# Patient Record
Sex: Male | Born: 1965 | Race: White | Hispanic: No | Marital: Single | Smoking: Never smoker
Health system: Southern US, Community
[De-identification: ages and names within clinical notes are randomized; demographics above are authoritative.]

## PROBLEM LIST (undated history)

## (undated) DIAGNOSIS — I471 Supraventricular tachycardia, unspecified: Secondary | ICD-10-CM

## (undated) DIAGNOSIS — G4730 Sleep apnea, unspecified: Secondary | ICD-10-CM

## (undated) DIAGNOSIS — G4733 Obstructive sleep apnea (adult) (pediatric): Secondary | ICD-10-CM

## (undated) DIAGNOSIS — K219 Gastro-esophageal reflux disease without esophagitis: Secondary | ICD-10-CM

## (undated) DIAGNOSIS — Z85528 Personal history of other malignant neoplasm of kidney: Secondary | ICD-10-CM

## (undated) DIAGNOSIS — R002 Palpitations: Secondary | ICD-10-CM

## (undated) DIAGNOSIS — E291 Testicular hypofunction: Secondary | ICD-10-CM

## (undated) HISTORY — DX: Obstructive sleep apnea (adult) (pediatric): G47.33

## (undated) HISTORY — DX: Morbid (severe) obesity due to excess calories: E66.01

## (undated) HISTORY — DX: Palpitations: R00.2

## (undated) HISTORY — PX: WRIST SURGERY: SHX841

## (undated) HISTORY — DX: Gastro-esophageal reflux disease without esophagitis: K21.9

## (undated) HISTORY — DX: Personal history of other malignant neoplasm of kidney: Z85.528

## (undated) HISTORY — DX: Testicular hypofunction: E29.1

## (undated) HISTORY — PX: OTHER SURGICAL HISTORY: SHX169

## (undated) HISTORY — PX: COLONOSCOPY: SHX174

## (undated) HISTORY — DX: Sleep apnea, unspecified: G47.30

## (undated) HISTORY — PX: KIDNEY SURGERY: SHX687

---

## 1997-11-15 DIAGNOSIS — C801 Malignant (primary) neoplasm, unspecified: Secondary | ICD-10-CM

## 1997-11-15 HISTORY — DX: Malignant (primary) neoplasm, unspecified: C80.1

## 1998-07-18 ENCOUNTER — Emergency Department (HOSPITAL_COMMUNITY): Admission: EM | Admit: 1998-07-18 | Discharge: 1998-07-18 | Payer: Self-pay | Admitting: Emergency Medicine

## 1998-07-18 ENCOUNTER — Encounter: Payer: Self-pay | Admitting: Emergency Medicine

## 1998-08-06 ENCOUNTER — Ambulatory Visit (HOSPITAL_COMMUNITY): Admission: RE | Admit: 1998-08-06 | Discharge: 1998-08-06 | Payer: Self-pay | Admitting: Orthopedic Surgery

## 1998-08-06 ENCOUNTER — Encounter: Payer: Self-pay | Admitting: Orthopedic Surgery

## 1998-08-13 ENCOUNTER — Encounter: Payer: Self-pay | Admitting: Thoracic Surgery

## 1998-08-14 ENCOUNTER — Inpatient Hospital Stay: Admission: RE | Admit: 1998-08-14 | Discharge: 1998-08-20 | Payer: Self-pay | Admitting: Thoracic Surgery

## 1998-08-14 ENCOUNTER — Encounter: Payer: Self-pay | Admitting: Thoracic Surgery

## 1998-08-15 ENCOUNTER — Encounter: Payer: Self-pay | Admitting: Thoracic Surgery

## 1998-08-16 ENCOUNTER — Encounter: Payer: Self-pay | Admitting: Thoracic Surgery

## 1998-08-18 ENCOUNTER — Encounter: Payer: Self-pay | Admitting: Thoracic Surgery

## 1998-08-19 ENCOUNTER — Encounter: Payer: Self-pay | Admitting: Orthopedic Surgery

## 2002-02-16 ENCOUNTER — Encounter: Payer: Self-pay | Admitting: Emergency Medicine

## 2002-02-16 ENCOUNTER — Inpatient Hospital Stay (HOSPITAL_COMMUNITY): Admission: EM | Admit: 2002-02-16 | Discharge: 2002-02-20 | Payer: Self-pay

## 2002-02-20 ENCOUNTER — Encounter: Payer: Self-pay | Admitting: Cardiovascular Disease

## 2003-10-23 HISTORY — PX: ESOPHAGOGASTRODUODENOSCOPY: SHX1529

## 2005-02-15 ENCOUNTER — Ambulatory Visit: Payer: Self-pay | Admitting: Oncology

## 2007-02-20 ENCOUNTER — Ambulatory Visit: Payer: Self-pay | Admitting: Oncology

## 2011-04-02 NOTE — Discharge Summary (Signed)
[facility]  Patient:    LOT, MEDFORD Visit Number: [id number] MRN: [id number]          Service Type: MED Location: 2000 2018 01 Attending Physician:  Ricki Rodriguez Dictated by:   Ricki Rodriguez, M.D. Admit Date:  02/16/2002 Discharge Date: 02/20/2002                             Discharge Summary  DISCHARGE DIAGNOSES: 1. Angina. 2. Reflux esophagitis. 3. Hypertension. 4. Status post left nephrectomy.  DISCHARGE MEDICATIONS: 1. Toprol XL 25 mg one p.o. daily. 2. Zoloft 50 mg one at bedtime. 3. Aciphex as before.  ACTIVITY:  As tolerated.  DIET:  Low salt, low fat diet.  DISCHARGE INSTRUCTIONS:  The patient is to see his GI specialist as needed.  FOLLOW-UP:  With Ricki Rodriguez, M.D. in one month.  The patient is to call [phone] for appointment.  HISTORY OF PRESENT ILLNESS:  This 45 year old white male had substernal chest pain nonradiating without any shortness of breath or sweating spell and had relief by rest.  The patient was chest pain-free on arrival to the emergency room, however, had some EKG abnormality en route to the hospital.  He has cardiac risk factors of hypertension and had family history of premature coronary artery disease.  Also has significant surgical history of left kidney cancer surgery in November of 1999.  PHYSICAL EXAMINATION:  VITAL SIGNS: Temperature 98, pulse 93, respirations 14, blood pressure 121/79, height 6 feet 1 inch, weight 248 pounds.  GENERAL: The patient was alert and oriented x 3.  HEENT: Head normocephalic with mild frontal baldness.  Eyes; blue, the patient wears glasses.  Conjunctivae pink. Ears, nose, and throat; mucous membranes were pink and moist.  NECK: No JVD. LUNGS: Clear bilaterally.  HEART: Audible S1 and S2.  ABDOMEN: Soft with left-sided scar of surgery.  EXTREMITIES: No clubbing, cyanosis, or edema. NEUROLOGICAL: Cranial nerves II-XII intact.  LABORATORY DATA:  Normal  hemoglobin and hematocrit.  Normal WBC count and platelet count.  Normal electrolytes, BUN and creatinine.  Normal CK-MB and troponin I.  EKG; sinus rhythm with nonspecific ST T change.  No clear stress test. Negative for pharamacologic-induced myocardial ischemia, ejection fraction of 67%.  Cholesterol and triglycerides normal.  Chest x-ray without any acute disease.  HOSPITAL COURSE:  The patient was initially placed in rule out MI observation and due to technical difficulty of nuclear stress testing machine, the patient remained in hospital for additional 48 hours.  He finally underwent nuclear stress test on February 20, 2002, and was discharged home in satisfactory condition with follow-up by me in one month. Dictated by:   Ricki Rodriguez, M.D. Attending Physician:  Ricki Rodriguez DD:  04/19/02 TD:  04/21/02 Job: [id number]

## 2015-05-08 DIAGNOSIS — I471 Supraventricular tachycardia: Secondary | ICD-10-CM | POA: Insufficient documentation

## 2015-11-19 DIAGNOSIS — R002 Palpitations: Secondary | ICD-10-CM | POA: Insufficient documentation

## 2015-11-19 HISTORY — DX: Palpitations: R00.2

## 2016-04-05 DIAGNOSIS — Z85528 Personal history of other malignant neoplasm of kidney: Secondary | ICD-10-CM | POA: Diagnosis not present

## 2017-11-10 ENCOUNTER — Telehealth: Payer: Self-pay | Admitting: Cardiology

## 2017-11-10 NOTE — Telephone Encounter (Signed)
Patient was last seen in [place] in May, 2018. States he has been having PVCs for the last ten days. Next available is January.

## 2017-11-11 NOTE — Telephone Encounter (Signed)
Try to get him in as soon as you can.Thank you.

## 2017-12-26 ENCOUNTER — Encounter (INDEPENDENT_AMBULATORY_CARE_PROVIDER_SITE_OTHER): Payer: Self-pay | Admitting: Orthopaedic Surgery

## 2017-12-26 ENCOUNTER — Ambulatory Visit (INDEPENDENT_AMBULATORY_CARE_PROVIDER_SITE_OTHER): Payer: BLUE CROSS/BLUE SHIELD | Admitting: Orthopaedic Surgery

## 2017-12-26 ENCOUNTER — Encounter (INDEPENDENT_AMBULATORY_CARE_PROVIDER_SITE_OTHER): Payer: Self-pay

## 2017-12-26 ENCOUNTER — Ambulatory Visit (INDEPENDENT_AMBULATORY_CARE_PROVIDER_SITE_OTHER): Payer: Self-pay

## 2017-12-26 ENCOUNTER — Other Ambulatory Visit (INDEPENDENT_AMBULATORY_CARE_PROVIDER_SITE_OTHER): Payer: Self-pay

## 2017-12-26 VITALS — BP 171/85 | HR 69 | Resp 18 | Ht 74.0 in | Wt 310.0 lb

## 2017-12-26 DIAGNOSIS — M25562 Pain in left knee: Secondary | ICD-10-CM

## 2017-12-26 DIAGNOSIS — M2242 Chondromalacia patellae, left knee: Secondary | ICD-10-CM

## 2017-12-26 NOTE — Progress Notes (Signed)
Office Visit Note   Patient: Alexander Travis           Date of Birth: [birth year]           MRN: [id number] Visit Date: 12/26/2017              Requested by: No referring provider defined for this encounter. PCP: Patient, No Pcp Per   Assessment & Plan: Visit Diagnoses:  1. Acute pain of left knee     Plan: Several month history of insidious onset left knee pain. Has considerable difficulty in the course of his employment activity with pain predominantly along the medial compartment. Has tried over-the-counter medicine. Just finished her course of oral prednisone with persistent discomfort. Films note some arthritic changes in the patellofemoral joint and lateral compartment but his pain is localized medially. I'm concerned that he may have a meniscal tear. We will order an MRI scan  Follow-Up Instructions: Return after MRI left knee.   Orders:  Orders Placed This Encounter  Procedures  . XR KNEE 3 VIEW LEFT   No orders of the defined types were placed in this encounter.     Procedures: No procedures performed   Clinical Data: No additional findings.   Subjective: Chief Complaint  Patient presents with  . Left Knee - Pain, Numbness, Weakness, Edema    Alexander Travis is a 52 y o here today for Right knee pain x 2 months. 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 HX of PSVT  No obvious history of injury or trauma. Had onset of left knee pain several months ago that resolved with time and over-the-counter medicines. He presently is having predominantly medial joint pain that has been resistant to her present treatment. He has pain with twisting motions along the medial compartment. He has difficulty was up on his feet for a length of time. No instability. Not sure that he's had any obvious swelling or denies any back pain or difficulty with the hip pain  HPI  Review of Systems  Constitutional:  Negative for fatigue.  HENT: Negative for hearing loss.   Respiratory: Negative for apnea, chest tightness and shortness of breath.   Cardiovascular: Positive for palpitations. Negative for chest pain and leg swelling.  Gastrointestinal: Negative for blood in stool, constipation and diarrhea.  Genitourinary: Negative for difficulty urinating.  Musculoskeletal: Negative for arthralgias, back pain, joint swelling, myalgias, neck pain and neck stiffness.  Neurological: Negative for weakness, numbness and headaches.  Hematological: Does not bruise/bleed easily.  Psychiatric/Behavioral: Negative for sleep disturbance. The patient is not nervous/anxious.      Objective: Vital Signs: BP (!) 171/85   Pulse 69   Resp 18   Ht 6\' 2"  (1.88 m)   Wt (!) 310 lb (140.6 kg)   BMI 39.80 kg/m   Physical Exam  Ortho Exam awake alert and oriented 3. Comfortable sitting. Examination left knee reveals no effusion. Diffuse medial joint pain mostly posteriorly. No popping or clicking. No lateral joint pain. Considerable patellar crepitation but without pain. No pain with motion of patella. Full extension and flexion over 100. No instability. No popliteal pain. No calf discomfort or swelling. Neurovascular exam intact. Straight leg raise negative. Painless range of motion both hips  Specialty Comments:  No specialty comments available.  Imaging: Xr Knee 3 View Left  Result Date: 12/26/2017 Films of the left knee were obtained in 3 projections standing. There is slight increased  valgus on the standing films. Small osteophyte noted lateral femoral condyle. The joint spaces are relatively well maintained. No ectopic calcification. Significant lateral patellar tilt with decrease in the lateral patella femoral joint. 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  History  reviewed. No pertinent past medical history.  History reviewed. No pertinent family history.  History reviewed. No pertinent surgical history. Social History   Occupational History  . Not on file  Tobacco Use  . Smoking status: Never Smoker  . Smokeless tobacco: Never Used  Substance and Sexual Activity  . Alcohol use: No    Frequency: Never  . Drug use: No  . Sexual activity: Not on file

## 2018-01-03 ENCOUNTER — Telehealth (INDEPENDENT_AMBULATORY_CARE_PROVIDER_SITE_OTHER): Payer: Self-pay | Admitting: Orthopaedic Surgery

## 2018-01-03 NOTE — Telephone Encounter (Signed)
Eliezer Lofts from [facility] left a voicemail stating that they would like to obtain the order for the MRI scan to verify it.  Please fax the order to # [phone] or call [phone]

## 2018-01-03 NOTE — Telephone Encounter (Signed)
Faxed information required to [phone]

## 2018-01-13 ENCOUNTER — Encounter (INDEPENDENT_AMBULATORY_CARE_PROVIDER_SITE_OTHER): Payer: Self-pay | Admitting: Orthopaedic Surgery

## 2018-01-13 ENCOUNTER — Encounter (INDEPENDENT_AMBULATORY_CARE_PROVIDER_SITE_OTHER): Payer: Self-pay

## 2018-01-13 ENCOUNTER — Telehealth (INDEPENDENT_AMBULATORY_CARE_PROVIDER_SITE_OTHER): Payer: Self-pay | Admitting: Orthopaedic Surgery

## 2018-01-13 ENCOUNTER — Ambulatory Visit (INDEPENDENT_AMBULATORY_CARE_PROVIDER_SITE_OTHER): Payer: BLUE CROSS/BLUE SHIELD | Admitting: Orthopaedic Surgery

## 2018-01-13 VITALS — BP 124/88 | HR 70 | Resp 14 | Ht 74.0 in | Wt 310.0 lb

## 2018-01-13 DIAGNOSIS — M25562 Pain in left knee: Secondary | ICD-10-CM | POA: Diagnosis not present

## 2018-01-13 DIAGNOSIS — G8929 Other chronic pain: Secondary | ICD-10-CM | POA: Diagnosis not present

## 2018-01-13 NOTE — Progress Notes (Signed)
   Office Visit Note   Patient: Alexander Travis           Date of Birth: 1966-08-29           MRN: [id number] Visit Date: 01/13/2018              Requested by: No referring provider defined for this encounter. PCP: Patient, No Pcp Per   Assessment & Plan: Visit Diagnoses:  1. Chronic pain of left knee     Plan: MRI scan demonstrates impaction injury or subchondral insufficiency fracture of the anterior medial aspect of the medial tibial condyle and edema of the marrow. The seems to be the area of his greatest tenderness. We will give him a relating no work from 01/04/2018 for 1 month. Limited to weightbearing the left lower extremity and reevaluate in 2 weeks. Might be a candidate for subchondral plasty  Follow-Up Instructions: Return in about 2 weeks (around 01/27/2018).   Orders:  No orders of the defined types were placed in this encounter.  No orders of the defined types were placed in this encounter.     Procedures: No procedures performed   Clinical Data: No additional findings.   Subjective: Chief Complaint  Patient presents with  . Left Knee - Pain    MRI results of Left knee   Mr. Pooler is been experiencing left knee pain with a initial onset in December. No injury or trauma. Pain is localized along the medial compartment of his knee. Because of his poor response to conservative treatment I ordered an MRI scan. The significant finding is evidence of an impaction injury or subchondral insufficiency fracture of the medial tibial plateau.  HPI  Review of Systems  Constitutional: Negative for fatigue.  HENT: Negative for hearing loss.   Respiratory: Positive for apnea. Negative for chest tightness and shortness of breath.   Cardiovascular: Negative for chest pain, palpitations and leg swelling.  Gastrointestinal: Negative for blood in stool, constipation and diarrhea.  Genitourinary: Negative for difficulty urinating.  Musculoskeletal: Negative for arthralgias,  back pain, joint swelling, myalgias, neck pain and neck stiffness.  Neurological: Negative for weakness, numbness and headaches.  Hematological: Does not bruise/bleed easily.  Psychiatric/Behavioral: Negative for sleep disturbance. The patient is not nervous/anxious.      Objective: Vital Signs: BP 124/88   Pulse 70   Resp 14   Ht 6\' 2"  (1.88 m)   Wt (!) 310 lb (140.6 kg)   BMI 39.80 kg/m   Physical Exam  Ortho Exam left knee with full extension and flexion. No instability. Local tenderness along the tibial plateau medially. Not necessarily at the joint. No lateral joint pain. No comfort. No skin changes. No edema. No popliteal pain calf pain or distal edema. Straight leg raise negative. Painless range of motion of hip. Neurovascular exam intact. Specialty Comments:  No specialty comments available.  Imaging: No results found.   PMFS History: There are no active problems to display for this patient.  History reviewed. No pertinent past medical history.  History reviewed. No pertinent family history.  Past Surgical History:  Procedure Laterality Date  . KIDNEY SURGERY     Social History   Occupational History  . Not on file  Tobacco Use  . Smoking status: Never Smoker  . Smokeless tobacco: Never Used  Substance and Sexual Activity  . Alcohol use: No    Frequency: Never  . Drug use: No  . Sexual activity: Not on file

## 2018-01-13 NOTE — Telephone Encounter (Signed)
Patient called stating that when he left the office he noticed that his return to work note has 3/20 and he needs it to say either 3/17 or 3/24 due to his pay cycle at work.

## 2018-01-13 NOTE — Telephone Encounter (Signed)
Corrected dates on note and faxed it to [phone].

## 2018-01-25 ENCOUNTER — Telehealth (INDEPENDENT_AMBULATORY_CARE_PROVIDER_SITE_OTHER): Payer: Self-pay | Admitting: Orthopaedic Surgery

## 2018-01-25 NOTE — Telephone Encounter (Signed)
Patient calling in reference to paper work he dropped off Monday to be filled out. Patient was requesting status of the paperwork. Please call to inform.

## 2018-01-26 NOTE — Telephone Encounter (Signed)
Called pt and notifed him papers were sent off to be filled out. Offered to give him Tirsa's number but he said he did not want it at this time and he would call back if he wanted it later.

## 2018-01-27 NOTE — Telephone Encounter (Signed)
Opened in error

## 2018-03-21 ENCOUNTER — Ambulatory Visit (INDEPENDENT_AMBULATORY_CARE_PROVIDER_SITE_OTHER): Payer: BLUE CROSS/BLUE SHIELD | Admitting: Orthopaedic Surgery

## 2018-03-21 ENCOUNTER — Encounter (INDEPENDENT_AMBULATORY_CARE_PROVIDER_SITE_OTHER): Payer: Self-pay | Admitting: Orthopaedic Surgery

## 2018-03-21 ENCOUNTER — Ambulatory Visit (INDEPENDENT_AMBULATORY_CARE_PROVIDER_SITE_OTHER): Payer: Self-pay

## 2018-03-21 VITALS — BP 135/88 | HR 77 | Resp 18 | Ht 74.0 in | Wt 313.0 lb

## 2018-03-21 DIAGNOSIS — G8929 Other chronic pain: Secondary | ICD-10-CM | POA: Diagnosis not present

## 2018-03-21 DIAGNOSIS — M25561 Pain in right knee: Secondary | ICD-10-CM

## 2018-03-21 NOTE — Progress Notes (Signed)
Office Visit Note   Patient: Alexander Travis           Date of Birth: [birth year]           MRN: [id number] Visit Date: 03/21/2018              Requested by: No referring provider defined for this encounter. PCP: Patient, No Pcp Per   Assessment & Plan: Visit Diagnoses:  1. Chronic pain of right knee     Plan: Painful medial compartment right knee without injury or trauma.  Recently experienced stress reaction in the medial compartment of his left knee that resolved spontaneously.  Of time off his feet.  Could have a similar problem on the right.  Will order an MRI scan.  Note to stay out of work until we obtain the scan and limit time on his feet.  Follow-Up Instructions: Return after MRI right knee.   Orders:  Orders Placed This Encounter  Procedures  . XR KNEE 3 VIEW RIGHT   No orders of the defined types were placed in this encounter.     Procedures: No procedures performed   Clinical Data: No additional findings.   Subjective: Chief Complaint  Patient presents with  . Right Knee - Pain  . New Patient (Initial Visit)    r knee pain started 06/2017, no injury, no numbness h=just pain  Alexander Travis was recently evaluated for a painful left knee.  MRI scan demonstrated a stress reaction in the medial compartment where he was symptomatic.  With limited time on his feet and staying out of work this resolved and he no longer has a problem.  Over the past several weeks he has had progressive pain in his right knee in a similar location along the medial compartment to the point where he cannot drive or stand.  No history of injury or trauma fever or chills.  HPI  Review of Systems  Constitutional: Negative for fatigue and fever.  HENT: Negative for ear pain.   Eyes: Negative for pain.  Respiratory: Negative for cough and shortness of breath.   Cardiovascular: Positive for leg swelling.  Gastrointestinal: Negative for constipation and diarrhea.  Genitourinary: Negative for  difficulty urinating.  Musculoskeletal: Negative for back pain and neck pain.  Skin: Positive for rash.  Allergic/Immunologic: Negative for food allergies.  Neurological: Positive for weakness. Negative for numbness.  Hematological: Does not bruise/bleed easily.  Psychiatric/Behavioral: Positive for sleep disturbance.     Objective: Vital Signs: BP 135/88 (BP Location: Left Arm, Patient Position: Sitting, Cuff Size: Normal)   Pulse 77   Resp 18   Ht 6\' 2"  (1.88 m)   Wt (!) 313 lb (142 kg)   BMI 40.19 kg/m   Physical Exam  Constitutional: He is oriented to person, place, and time. He appears well-developed and well-nourished.  HENT:  Mouth/Throat: Oropharynx is clear and moist.  Eyes: Pupils are equal, round, and reactive to light. EOM are normal.  Pulmonary/Chest: Effort normal.  Neurological: He is alert and oriented to person, place, and time.  Skin: Skin is warm and dry.  Psychiatric: He has a normal mood and affect. His behavior is normal.    Ortho Exam awake alert and oriented x3.  Comfortable sitting.  Walks with a limp referable to his right knee.  The right knee was not hot red warm or swollen.  There is diffuse tenderness along the medial tibial plateau more so than along the joint line.  No popping or clicking.  No effusion.  Range of motion 0-105.  No instability.  No popliteal pain.  No calf pain.  Some venous stasis changes distal.neurovascular exam intact  Specialty Comments:  No specialty comments available.  Imaging: Xr Knee 3 View Right  Result Date: 03/21/2018 Films of the right knee were obtained in several projections standing.  There is slight decrease in the medial joint space consistent with mild osteoarthritis of femoral joint which are also relatively mild.  No ectopic calcification.  Alignment looks fine.  No acute changes    PMFS History: There are no active problems to display for this patient.  History reviewed. No pertinent past medical  history.  History reviewed. No pertinent family history.  Past Surgical History:  Procedure Laterality Date  . KIDNEY SURGERY     Social History   Occupational History  . Not on file  Tobacco Use  . Smoking status: Never Smoker  . Smokeless tobacco: Never Used  Substance and Sexual Activity  . Alcohol use: No    Frequency: Never  . Drug use: No  . Sexual activity: Not on file

## 2018-03-30 ENCOUNTER — Encounter (INDEPENDENT_AMBULATORY_CARE_PROVIDER_SITE_OTHER): Payer: Self-pay | Admitting: Orthopaedic Surgery

## 2018-03-30 ENCOUNTER — Ambulatory Visit (INDEPENDENT_AMBULATORY_CARE_PROVIDER_SITE_OTHER): Payer: BLUE CROSS/BLUE SHIELD | Admitting: Orthopaedic Surgery

## 2018-03-30 VITALS — BP 136/84 | HR 71 | Resp 18 | Ht 74.0 in | Wt 310.0 lb

## 2018-03-30 DIAGNOSIS — M25561 Pain in right knee: Secondary | ICD-10-CM

## 2018-03-30 MED ORDER — BUPIVACAINE HCL 0.5 % IJ SOLN
2.0000 mL | INTRAMUSCULAR | Status: AC | PRN
  Administered 2018-03-30: 2 mL via INTRA_ARTICULAR

## 2018-03-30 MED ORDER — LIDOCAINE HCL 1 % IJ SOLN
2.0000 mL | INTRAMUSCULAR | Status: AC | PRN
  Administered 2018-03-30: 2 mL

## 2018-03-30 MED ORDER — METHYLPREDNISOLONE ACETATE 40 MG/ML IJ SUSP
80.0000 mg | INTRAMUSCULAR | Status: AC | PRN
  Administered 2018-03-30: 80 mg

## 2018-03-30 NOTE — Progress Notes (Signed)
Office Visit Note   Patient: Alexander Travis           Date of Birth: Apr 06, 1966           MRN: [id number] Visit Date: 03/30/2018              Requested by: No referring provider defined for this encounter. PCP: Patient, No Pcp Per   Assessment & Plan: Visit Diagnoses:  1. 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  There was mild patellar tendinosis without a tear.  Degeneration and fraying throughout the posterior body and horn of the medial meniscus with no well defined tear.  Pain is predominantly in the medial compartment.  We will try a cortisone injection.  Continue out of work until Sunday night the 19th.  Return to the office over the next 2 to 3 weeks if no improvement.  Consider arthroscopy  Follow-Up Instructions: No follow-ups on file.   Orders:  Orders Placed This Encounter  Procedures  . Large Joint Inj: R knee   No orders of the defined types were placed in this encounter.     Procedures: Large Joint Inj: R knee on 03/30/2018 11:52 AM Indications: pain and diagnostic evaluation Details: 25 G 1.5 in needle, anteromedial approach  Arthrogram: No  Medications: 2 mL lidocaine 1 %; 2 mL bupivacaine 0.5 %; 80 mg methylPREDNISolone acetate 40 MG/ML Procedure, treatment alternatives, risks and benefits explained, specific risks discussed. Consent was given by the patient. Immediately prior to procedure a time out was called to verify the correct patient, procedure, equipment, support staff and site/side marked as required. Patient was prepped and draped in the usual sterile fashion.       Clinical Data: No additional findings.   Subjective: Chief Complaint  Patient presents with  . Follow-up    MRI R KNEE REVIEW  Mr. Alexander Travis returns to the office for follow-up evaluation of his right knee pain is previously outlined.  MRI scan demonstrates severe chondromalacia the patella associated with some mild  arthritis in the medial compartment degeneration within the meniscus without a definitive tear.  Pain is predominantly in the medial compartment.  Has not noticed any effusion  HPI  Review of Systems  Constitutional: Negative for fatigue and fever.  HENT: Negative for ear pain.   Eyes: Negative for pain.  Respiratory: Negative for cough and shortness of breath.   Cardiovascular: Positive for leg swelling.  Gastrointestinal: Positive for diarrhea. Negative for constipation.  Genitourinary: Negative for difficulty urinating.  Musculoskeletal: Negative for back pain and neck pain.  Skin: Negative for rash.  Allergic/Immunologic: Negative for food allergies.  Neurological: Positive for weakness. Negative for numbness.  Hematological: Does not bruise/bleed easily.  Psychiatric/Behavioral: Positive for sleep disturbance.     Objective: Vital Signs: BP 136/84 (BP Location: Right Arm, Patient Position: Sitting, Cuff Size: Normal)   Pulse 71   Resp 18   Ht 6\' 2"  (1.88 m)   Wt (!) 310 lb (140.6 kg)   BMI 39.80 kg/m   Physical Exam  Constitutional: He is oriented to person, place, and time. He appears well-developed and well-nourished.  HENT:  Mouth/Throat: Oropharynx is clear and moist.  Eyes: Pupils are equal, round, and reactive to light. EOM are normal.  Pulmonary/Chest: Effort normal.  Neurological: He is alert and oriented to person, place, and time.  Skin: Skin is warm and dry.  Psychiatric: He has a normal mood and affect.  His behavior is normal.    Ortho Exam awake alert and oriented x3.  Comfortable sitting.  No effusion left knee.  No instability.  Some patellar crepitation and very minimal discomfort with patellar crepitation.  Slight lateral position of patella with flexion extension.  Full extension and flexion over 105 degrees.  Diffuse mild to moderate medial joint pain without popping or clicking.  No popliteal pain or mass.  No calf pain neurovascular exam  intact.  Specialty Comments:  No specialty comments available.  Imaging: No results found.   PMFS History: There are no active problems to display for this patient.  History reviewed. No pertinent past medical history.  History reviewed. No pertinent family history.  Past Surgical History:  Procedure Laterality Date  . KIDNEY SURGERY     Social History   Occupational History  . Not on file  Tobacco Use  . Smoking status: Never Smoker  . Smokeless tobacco: Never Used  Substance and Sexual Activity  . Alcohol use: No    Frequency: Never  . Drug use: No  . Sexual activity: Not on file

## 2018-04-07 ENCOUNTER — Ambulatory Visit (INDEPENDENT_AMBULATORY_CARE_PROVIDER_SITE_OTHER): Payer: BLUE CROSS/BLUE SHIELD | Admitting: Orthopaedic Surgery

## 2018-05-04 ENCOUNTER — Ambulatory Visit (INDEPENDENT_AMBULATORY_CARE_PROVIDER_SITE_OTHER): Payer: BLUE CROSS/BLUE SHIELD | Admitting: Orthopaedic Surgery

## 2018-05-04 ENCOUNTER — Encounter (INDEPENDENT_AMBULATORY_CARE_PROVIDER_SITE_OTHER): Payer: Self-pay | Admitting: Orthopaedic Surgery

## 2018-05-04 VITALS — BP 135/88 | HR 71 | Ht 74.0 in | Wt 310.0 lb

## 2018-05-04 DIAGNOSIS — G8929 Other chronic pain: Secondary | ICD-10-CM | POA: Diagnosis not present

## 2018-05-04 DIAGNOSIS — M25561 Pain in right knee: Secondary | ICD-10-CM

## 2018-05-04 NOTE — Progress Notes (Signed)
Office Visit Note   Patient: Alexander Travis           Date of Birth: 19-Jun-1966           MRN: [id number] Visit Date: 05/04/2018              Requested by: No referring provider defined for this encounter. PCP: Patient, No Pcp Per   Assessment & Plan: Visit Diagnoses:  1. Chronic pain of right knee     Plan: [name] continues to have pain in his right knee predominantly localized along the medial compartment I scan performed last month demonstrating some degenerative changes of the medial meniscus and severe chondromalacia of the patella.  There is no evidence of any acute change.  He is been out of work as he has had difficulty bearing weight on his knee.  When he is off of his feet he feels much better.  Sometimes he has a feeling of his knee locking or catching with all the pain localized medially.  Despite the MRI scan demonstrating severe chondromalacia he has not had any trouble going up and down stairs or maneuvering inclines.  I think at this point is worth considering a knee arthroscopy.  He said cortisone, time, over-the-counter medicines without much relief.  I did look at the medial meniscus and even evaluate the patella.  Long discussion he like to proceed.  We will keep him out of work until he is feeling better. I think the arthroscopy is the best approach as he is had the problem for so long and not feeling much improved.  I discussed the surgery with him in detail and told the certainly there may be limitations that he still may have some persistent pain  Follow-Up Instructions: Return will schedule surgery right knee.   Orders:  No orders of the defined types were placed in this encounter.  No orders of the defined types were placed in this encounter.     Procedures: No procedures performed   Clinical Data: No additional findings.   Subjective: Chief Complaint  Patient presents with  . Right Knee - Pain  . Follow-up    R KNEE PAIN 3 MO, NO INJURY.  INJECTION 03/2018 DIDNT HELP  Chronic pain predominant along the medial compartment of his right knee as previously outlined.  [name] had an MRI scan with the results of his chart.  His pain is localized along the medial compartment posteriorly as if he is a tear of the medial meniscus.  There are some degenerative changes but no distinct tear.  He does have severe chondromalacia of the patella but without much symptoms.  He has had some recurrent effusions  HPI  Review of Systems  Constitutional: Negative for fatigue and fever.  HENT: Negative for ear pain.   Eyes: Negative for pain.  Respiratory: Negative for cough and shortness of breath.   Cardiovascular: Negative for leg swelling.  Gastrointestinal: Negative for constipation and diarrhea.  Genitourinary: Negative for difficulty urinating.  Musculoskeletal: Negative for back pain and neck pain.  Skin: Negative for rash.  Allergic/Immunologic: Negative for food allergies.  Neurological: Negative for weakness and numbness.  Hematological: Does not bruise/bleed easily.  Psychiatric/Behavioral: Positive for sleep disturbance.     Objective: Vital Signs: BP 135/88 (BP Location: Left Arm, Patient Position: Sitting, Cuff Size: Normal)   Pulse 71   Ht 6\' 2"  (1.88 m)   Wt (!) 310 lb (140.6 kg)   BMI 39.80 kg/m   Physical  Exam  Ortho Exam awake alert and oriented x3.  Comfortable sitting.  Right knee exam without effusion today.  He does have some patellar crepitation but not much pain with patella compression or motion medially and laterally.  No opening with varus or valgus stress.  Diffuse tenderness along the medial compartment particularly posteriorly.  I did not feel any masses.  No opening with varus valgus stress.  No pain over the MCL.  No pain over the proximal medial tibia full flexion extension.  No popliteal mass  Specialty Comments:  No specialty comments available.  Imaging: No results found.   PMFS  History: There are no active problems to display for this patient.  History reviewed. No pertinent past medical history.  History reviewed. No pertinent family history.  Past Surgical History:  Procedure Laterality Date  . KIDNEY SURGERY     Social History   Occupational History  . Not on file  Tobacco Use  . Smoking status: Never Smoker  . Smokeless tobacco: Never Used  Substance and Sexual Activity  . Alcohol use: No    Frequency: Never  . Drug use: No  . Sexual activity: Not on file

## 2018-05-08 ENCOUNTER — Telehealth (INDEPENDENT_AMBULATORY_CARE_PROVIDER_SITE_OTHER): Payer: Self-pay | Admitting: Orthopaedic Surgery

## 2018-05-08 NOTE — Telephone Encounter (Signed)
Left message on voice mail providing patient with my name and direct extension so that patient may call me when is ready to schedule surgery with D. Whitfield for his right knee scope.

## 2018-05-29 ENCOUNTER — Inpatient Hospital Stay (INDEPENDENT_AMBULATORY_CARE_PROVIDER_SITE_OTHER): Payer: BLUE CROSS/BLUE SHIELD | Admitting: Orthopaedic Surgery

## 2018-05-29 ENCOUNTER — Encounter (HOSPITAL_COMMUNITY): Payer: Self-pay | Admitting: *Deleted

## 2018-05-29 ENCOUNTER — Telehealth (INDEPENDENT_AMBULATORY_CARE_PROVIDER_SITE_OTHER): Payer: Self-pay | Admitting: Orthopaedic Surgery

## 2018-05-29 MED ORDER — ACETAMINOPHEN 10 MG/ML IV SOLN
1000.0000 mg | Freq: Once | INTRAVENOUS | Status: AC
  Administered 2018-05-30: 1000 mg via INTRAVENOUS

## 2018-05-29 NOTE — Telephone Encounter (Signed)
Jan from pre-admissions callling.-she needs orders TODAY.  Patient having surgery tomorrow.  Is Aaron Edelman still doing orders?  The following message sent to [facility] five days ago:   Filomena Jungling, PA-C        Patient was rescheduled from [facility] on July 11 to [facility] on July 16th. I received the following message from the RN at [facility]:    [facility],  As per anesthesia, Dr. Rudene Anda pt, BROWNIE NEHME, DOB June 08, 1966, DOS 05-25-2018 is a better candidate to be done in a hospital setting. Pt. has several health issues that make him a high risk to be done in an ambulatory surgical center setting.    Dr. Durward Fortes is aware of the rescheduled case. However, I rescheduled this from OUTPATIENT to 23 HR OBS. Considering patients health issues. Is this OKAY, or should I have left it OUTPATIENT?    Right Knee Scope w/Debridement  [facility]/OBS  05-30-18 @ 10AM

## 2018-05-29 NOTE — Telephone Encounter (Signed)
thanks

## 2018-05-29 NOTE — Telephone Encounter (Signed)
Please advise 

## 2018-05-29 NOTE — Progress Notes (Signed)
Anesthesia Chart Review: Alexander Travis  Case:  [id number] Date/Time:  05/30/18 0945   Procedure:  RIGHT KNEE ARTHROSCOPY WITH MEDIAL MENISECTOMY (Right )   Anesthesia type:  General   Pre-op diagnosis:  RIGHT TORN MEDIAL MENISCUS   Location:  [place] OR ROOM 07 / [place] OR   Surgeon:  Garald Balding, MD      DISCUSSION: Patient is a 52 year old male scheduled for the above procedure. History includes never smoker, paroxysmal supraventricular tachycardia (PSVT; on flecainide, metoprolol). By previous records history also includes renal cancer (s/p left nephrectomy ~'99), prior lung biopsy, and obesity.   2016 cardiology tests and 2018 EKG requested from [facility] (now a devision of [facility]), but have not yet been received. I have updated anesthesiologist Dr. Oleta Mouse. Anesthesiologist to evaluate on the day of surgery and review new information if received. Definitive anesthesia plan at that time.  VS: Wt (!) 310 lb (140.6 kg) Comment: 05/04/18  BMI 39.80 kg/m   PROVIDERS: No PCP is listed.  Shirlee More, MD is cardiologist. Last visit 03/22/17 when he was with [name] (now Dr. Bettina Gavia is with [facility]).   LABS: Patient is a same day work-up, so labs to be done on arrival.    EKG: He will need an updated EKG if not done within the past year. 04/01/17 tracing requested from Dr. Bettina Gavia previous office, but is still pending.    CV: Stress echo/echo from 2016 requested from Dr. Joya Gaskins previous office, but is still pending.   Past Medical History:  Diagnosis Date  . PSVT (paroxysmal supraventricular tachycardia) ([place])     Past Surgical History:  Procedure Laterality Date  . KIDNEY SURGERY    . lung biospy    . WRIST SURGERY      MEDICATIONS: . [START ON 05/30/2018] acetaminophen (OFIRMEV) IV 1,000 mg   . ammonium lactate (LAC-HYDRIN) 12 % lotion  . flecainide (TAMBOCOR) 50 MG tablet  . metoprolol tartrate (LOPRESSOR) 25 MG tablet     George Hugh [facility] Short Stay Center/Anesthesiology Phone [phone] 05/29/2018 4:44 PM

## 2018-05-29 NOTE — Telephone Encounter (Signed)
Aaron Edelman said he would submit the orders

## 2018-05-29 NOTE — Progress Notes (Signed)
I was unable to reach patient by phone.  I left  A message on voice mail.  I instructed the patient to arrive at [place] entrance at -7:30am  , nothing to eat or drink after midnight.   I instructed the patient to take the following medications in the am with just enough water to get them down: Flecainide, Metoprolol I asked patient to not wear any lotions, powders, cologne, jewelry, piercing, make-up or nail polish.  I asked the patient to call [phone], in the am if there were any questions or problems.

## 2018-05-29 NOTE — H&P (Signed)
Alexander Fears, MD   Alexander Borg, PA-C [address], [place], [place]  [address]                             [phone]   ORTHOPAEDIC HISTORY & PHYSICAL  Alexander Travis MRN:  [id number] DOB/SEX:  July 11, 1966/male  CHIEF COMPLAINT:  Painful right knee  HISTORY:  52 y/o male with right knee pain without history of injury.Chronic pain predominant along the medial compartment of his right knee as previously outlined.  Alexander Travis had an MRI scan with the results of MRI scan right knee demonstrated severe chondromalacia patella.  There was mild patellar tendinosis without a tear.  Degeneration and fraying throughout the posterior body and horn of the medial meniscus with no well defined tear.  His pain is localized along the medial compartment posteriorly as if he is a tear of the medial meniscus.  There are some degenerative changes but no distinct tear.  He does have severe chondromalacia of the patella but without much symptoms.  He has had some recurrent effusions    PAST MEDICAL HISTORY: There are no active problems to display for this patient.  Past Medical History:  Diagnosis Date  . PSVT (paroxysmal supraventricular tachycardia) ([place])    Past Surgical History:  Procedure Laterality Date  . KIDNEY SURGERY    . lung biospy    . WRIST SURGERY       MEDICATIONS:   No medications prior to admission.    ALLERGIES:   Allergies  Allergen Reactions  . Codeine     Other reaction(s): Other (See Comments) Hematuria    REVIEW OF SYSTEMS:  Constitutional: Negative for fatigue and fever.  HENT: Negative for ear pain.   Eyes: Negative for pain.  Respiratory: Negative for cough and shortness of breath.   Cardiovascular: Negative for leg swelling.  Gastrointestinal: Negative for constipation and diarrhea.  Genitourinary: Negative for difficulty urinating.  Musculoskeletal: Negative for back pain and neck pain.  Skin: Negative for rash.  Allergic/Immunologic: Negative  for food allergies.  Neurological: Negative for weakness and numbness.  Hematological: Does not bruise/bleed easily.  Psychiatric/Behavioral: Positive for sleep disturbance.   Ortho Exam:   Right knee exam without effusion today.  He does have some patellar crepitation but not much pain with patella compression or motion medially and laterally.  No opening with varus or valgus stress.  Diffuse tenderness along the medial compartment particularly posteriorly.  No masses felt.  No opening with varus valgus stress.  No pain over the MCL.  No pain over the proximal medial tibia full flexion extension.  No popliteal mass    FAMILY HISTORY:  No family history on file.  SOCIAL HISTORY:   Social History   Tobacco Use  . Smoking status: Never Smoker  . Smokeless tobacco: Never Used  Substance Use Topics  . Alcohol use: No    Frequency: Never      EXAMINATION: Vital Signs: BP 135/88 (BP Location: Left Arm, Patient Position: Sitting, Cuff Size: Normal)   Pulse 71   Ht 6\' 2"  (1.88 m)   Wt (!) 310 lb (140.6 kg)   BMI 39.80 kg/m     Head is normocephalic.   Eyes:  Pupils equal, round and reactive to light and accommodation.  Extraocular intact. ENT: Ears, nose, and throat were benign.   Neck: supple, no bruits were noted.   Chest: good expansion.   Lungs: essentially clear.  Cardiac: regular rhythm and rate, normal S1, S2.  No murmurs appreciated. Pulses :  1+ bilateral and symmetric in bilateral lower extremities. Abdomen is scaphoid, soft, nontender, no masses palpable, normal bowel sounds  present. CNS:  He is oriented x3 and cranial nerves II-XII grossly intact. Breast, rectal, and genital exams: not performed and not indicated for an orthopedic evalu  Ortho Exam awake alert and oriented x3.  Comfortable sitting.  Right knee exam without effusion today.  He does have some patellar crepitation but not much pain with patella compression or motion medially and laterally.  No opening  with varus or valgus stress.  Diffuse tenderness along the medial compartment particularly posteriorly.  I did not feel any masses.  No opening with varus valgus stress.  No pain over the MCL.  No pain over the proximal medial tibia full flexion extension.  No popliteal mass ation. Musculoskeletal:    Imaging Review See HPI  ASSESSMENT: right knee chondromalacia with probable medial meniscal tear with failed conservative treatment  Past Medical History:  Diagnosis Date  . PSVT (paroxysmal supraventricular tachycardia) ([facility])     PLAN: Plan for right knee arthroscopic debridement, possible medial menisectomy  The procedure,  risks, and benefits of surgery were presented and reviewed. The risks including but not limited to infection, blood clots, vascular and nerve injury, stiffness,  among others were discussed. The patient acknowledged the explanation, agreed to proceed.   Alexander Travis, [place] [phone]  05/29/2018 9:31 AM

## 2018-05-30 ENCOUNTER — Ambulatory Visit (HOSPITAL_COMMUNITY)
Admission: RE | Admit: 2018-05-30 | Discharge: 2018-05-30 | Disposition: A | Discharge status: Home / Self Care | Payer: BLUE CROSS/BLUE SHIELD | Source: Ambulatory Visit | Attending: Orthopaedic Surgery | Admitting: Orthopaedic Surgery

## 2018-05-30 ENCOUNTER — Encounter (HOSPITAL_COMMUNITY): Payer: Self-pay | Admitting: Anesthesiology

## 2018-05-30 ENCOUNTER — Encounter (HOSPITAL_COMMUNITY)
Admission: RE | Disposition: A | Discharge status: Home / Self Care | Payer: Self-pay | Source: Ambulatory Visit | Attending: Orthopaedic Surgery

## 2018-05-30 ENCOUNTER — Ambulatory Visit (HOSPITAL_COMMUNITY): Payer: BLUE CROSS/BLUE SHIELD | Admitting: Vascular Surgery

## 2018-05-30 DIAGNOSIS — Z79899 Other long term (current) drug therapy: Secondary | ICD-10-CM | POA: Insufficient documentation

## 2018-05-30 DIAGNOSIS — X58XXXA Exposure to other specified factors, initial encounter: Secondary | ICD-10-CM | POA: Diagnosis not present

## 2018-05-30 DIAGNOSIS — M1711 Unilateral primary osteoarthritis, right knee: Secondary | ICD-10-CM | POA: Insufficient documentation

## 2018-05-30 DIAGNOSIS — I471 Supraventricular tachycardia: Secondary | ICD-10-CM | POA: Diagnosis not present

## 2018-05-30 DIAGNOSIS — Y929 Unspecified place or not applicable: Secondary | ICD-10-CM | POA: Insufficient documentation

## 2018-05-30 DIAGNOSIS — S83241A Other tear of medial meniscus, current injury, right knee, initial encounter: Secondary | ICD-10-CM | POA: Insufficient documentation

## 2018-05-30 DIAGNOSIS — I10 Essential (primary) hypertension: Secondary | ICD-10-CM | POA: Diagnosis not present

## 2018-05-30 DIAGNOSIS — M23321 Other meniscus derangements, posterior horn of medial meniscus, right knee: Secondary | ICD-10-CM

## 2018-05-30 DIAGNOSIS — Z6839 Body mass index (BMI) 39.0-39.9, adult: Secondary | ICD-10-CM | POA: Insufficient documentation

## 2018-05-30 HISTORY — PX: KNEE ARTHROSCOPY WITH MEDIAL MENISECTOMY: SHX5651

## 2018-05-30 HISTORY — DX: Supraventricular tachycardia, unspecified: I47.10

## 2018-05-30 HISTORY — DX: Supraventricular tachycardia: I47.1

## 2018-05-30 LAB — CBC
HEMATOCRIT: 47.5 % (ref 39.0–52.0)
HEMOGLOBIN: 15.3 g/dL (ref 13.0–17.0)
MCH: 29.8 pg (ref 26.0–34.0)
MCHC: 32.2 g/dL (ref 30.0–36.0)
MCV: 92.4 fL (ref 78.0–100.0)
Platelets: 226 10*3/uL (ref 150–400)
RBC: 5.14 MIL/uL (ref 4.22–5.81)
RDW: 13.2 % (ref 11.5–15.5)
WBC: 7.9 10*3/uL (ref 4.0–10.5)

## 2018-05-30 LAB — BASIC METABOLIC PANEL
Anion gap: 11 (ref 5–15)
BUN: 12 mg/dL (ref 6–20)
CO2: 25 mmol/L (ref 22–32)
Calcium: 9.4 mg/dL (ref 8.9–10.3)
Chloride: 104 mmol/L (ref 98–111)
Creatinine, Ser: 1.37 mg/dL — ABNORMAL HIGH (ref 0.61–1.24)
GFR, EST NON AFRICAN AMERICAN: 57 mL/min — AB (ref >60)
Glucose, Bld: 121 mg/dL — ABNORMAL HIGH (ref 70–99)
POTASSIUM: 4 mmol/L (ref 3.5–5.1)
SODIUM: 140 mmol/L (ref 135–145)

## 2018-05-30 SURGERY — ARTHROSCOPY, KNEE, WITH MEDIAL MENISCECTOMY
Anesthesia: General | Site: Knee | Laterality: Right

## 2018-05-30 MED ORDER — DEXAMETHASONE SODIUM PHOSPHATE 10 MG/ML IJ SOLN
INTRAMUSCULAR | Status: AC
  Filled 2018-05-30: qty 1

## 2018-05-30 MED ORDER — LIDOCAINE 2% (20 MG/ML) 5 ML SYRINGE
INTRAMUSCULAR | Status: AC
  Filled 2018-05-30: qty 5

## 2018-05-30 MED ORDER — MEPERIDINE HCL 50 MG/ML IJ SOLN: 6.2500 mg | INTRAMUSCULAR | Status: DC | PRN

## 2018-05-30 MED ORDER — PHENYLEPHRINE 40 MCG/ML (10ML) SYRINGE FOR IV PUSH (FOR BLOOD PRESSURE SUPPORT)
PREFILLED_SYRINGE | INTRAVENOUS | Status: DC | PRN
  Administered 2018-05-30 (×2): 80 ug via INTRAVENOUS
  Administered 2018-05-30: 120 ug via INTRAVENOUS

## 2018-05-30 MED ORDER — DEXAMETHASONE SODIUM PHOSPHATE 10 MG/ML IJ SOLN
INTRAMUSCULAR | Status: DC | PRN
  Administered 2018-05-30: 10 mg via INTRAVENOUS

## 2018-05-30 MED ORDER — FENTANYL CITRATE (PF) 250 MCG/5ML IJ SOLN
INTRAMUSCULAR | Status: AC
  Filled 2018-05-30: qty 5

## 2018-05-30 MED ORDER — PROPOFOL 10 MG/ML IV BOLUS
INTRAVENOUS | Status: AC
  Filled 2018-05-30: qty 20

## 2018-05-30 MED ORDER — ONDANSETRON HCL 4 MG/2ML IJ SOLN
INTRAMUSCULAR | Status: DC | PRN
  Administered 2018-05-30: 4 mg via INTRAVENOUS

## 2018-05-30 MED ORDER — HYDROCODONE-ACETAMINOPHEN 5-325 MG PO TABS
1.0000 | ORAL_TABLET | Freq: Four times a day (QID) | ORAL | Status: DC | PRN
  Administered 2018-05-30: 1 via ORAL

## 2018-05-30 MED ORDER — PHENYLEPHRINE 40 MCG/ML (10ML) SYRINGE FOR IV PUSH (FOR BLOOD PRESSURE SUPPORT)
PREFILLED_SYRINGE | INTRAVENOUS | Status: AC
  Filled 2018-05-30: qty 10

## 2018-05-30 MED ORDER — HYDROCODONE-ACETAMINOPHEN 5-325 MG PO TABS
ORAL_TABLET | ORAL | Status: AC
  Filled 2018-05-30: qty 1

## 2018-05-30 MED ORDER — LIDOCAINE-EPINEPHRINE 1 %-1:100000 IJ SOLN
INTRAMUSCULAR | Status: AC
  Filled 2018-05-30: qty 1

## 2018-05-30 MED ORDER — CHLORHEXIDINE GLUCONATE 4 % EX LIQD: 60.0000 mL | Freq: Once | CUTANEOUS | Status: DC

## 2018-05-30 MED ORDER — SODIUM CHLORIDE 0.9 % IJ SOLN
INTRAMUSCULAR | Status: AC
  Filled 2018-05-30: qty 10

## 2018-05-30 MED ORDER — EPHEDRINE SULFATE 50 MG/ML IJ SOLN
INTRAMUSCULAR | Status: AC
  Filled 2018-05-30: qty 1

## 2018-05-30 MED ORDER — HYDROCODONE-ACETAMINOPHEN 7.5-325 MG PO TABS: 1.0000 | ORAL_TABLET | Freq: Once | ORAL | Status: DC | PRN

## 2018-05-30 MED ORDER — ONDANSETRON HCL 4 MG/2ML IJ SOLN
INTRAMUSCULAR | Status: AC
  Filled 2018-05-30: qty 2

## 2018-05-30 MED ORDER — HYDROMORPHONE HCL 2 MG PO TABS: ORAL_TABLET | ORAL | 0 refills | Status: DC

## 2018-05-30 MED ORDER — LIDOCAINE HCL (CARDIAC) PF 100 MG/5ML IV SOSY
PREFILLED_SYRINGE | INTRAVENOUS | Status: DC | PRN
  Administered 2018-05-30: 100 mg via INTRAVENOUS

## 2018-05-30 MED ORDER — ONDANSETRON HCL 4 MG/2ML IJ SOLN: 4.0000 mg | Freq: Once | INTRAMUSCULAR | Status: DC | PRN

## 2018-05-30 MED ORDER — MIDAZOLAM HCL 2 MG/2ML IJ SOLN
INTRAMUSCULAR | Status: AC
  Filled 2018-05-30: qty 2

## 2018-05-30 MED ORDER — FENTANYL CITRATE (PF) 100 MCG/2ML IJ SOLN: 25.0000 ug | INTRAMUSCULAR | Status: DC | PRN

## 2018-05-30 MED ORDER — SODIUM CHLORIDE 0.9 % IR SOLN
Status: DC | PRN
  Administered 2018-05-30: 6000 mL

## 2018-05-30 MED ORDER — SODIUM CHLORIDE 0.9 % IV SOLN: INTRAVENOUS | Status: DC

## 2018-05-30 MED ORDER — LACTATED RINGERS IV SOLN
INTRAVENOUS | Status: DC
  Administered 2018-05-30: 10 mL/h via INTRAVENOUS

## 2018-05-30 MED ORDER — MIDAZOLAM HCL 5 MG/5ML IJ SOLN
INTRAMUSCULAR | Status: DC | PRN
  Administered 2018-05-30: 2 mg via INTRAVENOUS

## 2018-05-30 MED ORDER — FENTANYL CITRATE (PF) 100 MCG/2ML IJ SOLN
INTRAMUSCULAR | Status: DC | PRN
  Administered 2018-05-30: 100 ug via INTRAVENOUS

## 2018-05-30 MED ORDER — BUPIVACAINE-EPINEPHRINE 0.25% -1:200000 IJ SOLN
INTRAMUSCULAR | Status: DC | PRN
  Administered 2018-05-30: 10 mL

## 2018-05-30 MED ORDER — PROPOFOL 10 MG/ML IV BOLUS
INTRAVENOUS | Status: DC | PRN
  Administered 2018-05-30: 200 mg via INTRAVENOUS
  Administered 2018-05-30 (×2): 50 mg via INTRAVENOUS

## 2018-05-30 SURGICAL SUPPLY — 33 items
BANDAGE ACE 6X5 VEL STRL LF (GAUZE/BANDAGES/DRESSINGS) ×3 IMPLANT
BLADE CUDA 5.5 (BLADE) ×2 IMPLANT
BLADE GREAT WHITE 4.2 (BLADE) ×2 IMPLANT
BLADE GREAT WHITE 4.2MM (BLADE) ×1
BUR OVAL 6.0 (BURR) IMPLANT
CUFF TOURNIQUET SINGLE 34IN LL (TOURNIQUET CUFF) IMPLANT
CUFF TOURNIQUET SINGLE 44IN (TOURNIQUET CUFF) IMPLANT
DRAPE ARTHROSCOPY W/POUCH 114 (DRAPES) ×3 IMPLANT
DRSG EMULSION OIL 3X3 NADH (GAUZE/BANDAGES/DRESSINGS) ×3 IMPLANT
DURAPREP 26ML APPLICATOR (WOUND CARE) ×3 IMPLANT
GAUZE SPONGE 4X4 12PLY STRL (GAUZE/BANDAGES/DRESSINGS) ×3 IMPLANT
GLOVE BIOGEL PI IND STRL 8 (GLOVE) ×1 IMPLANT
GLOVE BIOGEL PI IND STRL 8.5 (GLOVE) ×1 IMPLANT
GLOVE BIOGEL PI INDICATOR 8 (GLOVE) ×2
GLOVE BIOGEL PI INDICATOR 8.5 (GLOVE) ×2
GLOVE ECLIPSE 8.0 STRL XLNG CF (GLOVE) ×3 IMPLANT
GLOVE ECLIPSE 8.5 STRL (GLOVE) ×3 IMPLANT
GOWN STRL REUS W/ TWL LRG LVL3 (GOWN DISPOSABLE) ×2 IMPLANT
GOWN STRL REUS W/ TWL XL LVL3 (GOWN DISPOSABLE) ×1 IMPLANT
GOWN STRL REUS W/TWL LRG LVL3 (GOWN DISPOSABLE) ×6
GOWN STRL REUS W/TWL XL LVL3 (GOWN DISPOSABLE) ×3
KIT TURNOVER KIT B (KITS) ×3 IMPLANT
MANIFOLD NEPTUNE II (INSTRUMENTS) ×3 IMPLANT
PACK ARTHROSCOPY DSU (CUSTOM PROCEDURE TRAY) ×3 IMPLANT
PAD ARMBOARD 7.5X6 YLW CONV (MISCELLANEOUS) ×6 IMPLANT
PROBE BIPOLAR ATHRO 135MM 90D (MISCELLANEOUS) ×2 IMPLANT
SET ARTHROSCOPY TUBING (MISCELLANEOUS) ×3
SET ARTHROSCOPY TUBING LN (MISCELLANEOUS) ×1 IMPLANT
SPONGE LAP 18X18 X RAY DECT (DISPOSABLE) ×2 IMPLANT
SPONGE LAP 4X18 RFD (DISPOSABLE) ×3 IMPLANT
TOWEL OR 17X24 6PK STRL BLUE (TOWEL DISPOSABLE) ×6 IMPLANT
WAND STAR VAC 90 (SURGICAL WAND) ×2 IMPLANT
WATER STERILE IRR 1000ML POUR (IV SOLUTION) ×3 IMPLANT

## 2018-05-30 NOTE — Discharge Instructions (Signed)
°  Post Anesthesia Home Care Instructions  Activity: Get plenty of rest for the remainder of the day. A responsible individual must stay with you for 24 hours following the procedure.  For the next 24 hours, DO NOT: -Drive a car -Paediatric nurse -Drink alcoholic beverages -Take any medication unless instructed by your physician -Make any legal decisions or sign important papers.  Meals: Start with liquid foods such as gelatin or soup. Progress to regular foods as tolerated. Avoid greasy, spicy, heavy foods. If nausea and/or vomiting occur, drink only clear liquids until the nausea and/or vomiting subsides. Call your physician if vomiting continues.  Special Instructions/Symptoms: Your throat may feel dry or sore from the anesthesia or the breathing tube placed in your throat during surgery. If this causes discomfort, gargle with warm salt water. The discomfort should disappear within 24 hours.  If you had a scopolamine patch placed behind your ear for the management of post- operative nausea and/or vomiting:  1. The medication in the patch is effective for 72 hours, after which it should be removed.  Wrap patch in a tissue and discard in the trash. Wash hands thoroughly with soap and water. 2. You may remove the patch earlier than 72 hours if you experience unpleasant side effects which may include dry mouth, dizziness or visual disturbances. 3. Avoid touching the patch. Wash your hands with soap and water after contact with the patch.   Discharge Instructions after Knee Arthroscopy   You will have a light dressing on your knee.  Leave the dressing in place until the third day after your surgery and then remove it and place a band-aid over the stitches.  After the bandage has been removed you may shower, but do not soak the incision. You may begin gentle motion of your leg immediately after surgery. Pump your foot up and down 20 times per hour, every hour you are awake.  Apply ice to  the knee 3 times per day for 30 minutes for the first 1 week until your knee is feeling comfortable again. Do not use heat.  You may begin straight leg raising exercises (if you have a brace with it on). While lying down, pull your foot all the way up, tighten your quadriceps muscle and lift your heel off of the ground. Hold this position for 2 seconds, and then let the leg back down. Repeat the exercise 10 times, at least 3 times a day.  Pain medicine has been prescribed for you.  Use your medicine as needed over the first 48 hours, and then you can begin to taper your use. You may take Extra Strength Tylenol or Tylenol only in place of the pain pills.    Please call [phone] for any problems. Including the following:  - excessive redness of the incisions - drainage for more than 4 days - fever of more than 101.5 F  *Please note that pain medications will not be refilled after hours or on weekends.

## 2018-05-30 NOTE — Anesthesia Procedure Notes (Signed)
Procedure Name: LMA Insertion Date/Time: 05/30/2018 10:30 AM Performed by: Jenne Campus, CRNA Pre-anesthesia Checklist: Patient identified, Emergency Drugs available, Suction available and Patient being monitored Patient Re-evaluated:Patient Re-evaluated prior to induction Oxygen Delivery Method: Circle System Utilized Preoxygenation: Pre-oxygenation with 100% oxygen Induction Type: IV induction Ventilation: Mask ventilation without difficulty LMA: LMA inserted LMA Size: 5.0 Number of attempts: 1 Airway Equipment and Method: Bite block Placement Confirmation: positive ETCO2 and breath sounds checked- equal and bilateral Tube secured with: Tape Dental Injury: Teeth and Oropharynx as per pre-operative assessment

## 2018-05-30 NOTE — Progress Notes (Signed)
The recent History & Physical has been reviewed. I have personally examined the patient today. There is no interval change to the documented History & Physical. The patient would like to proceed with the procedure.  Alexander Travis 05/30/2018,  9:38 AM  Patient ID: Alexander Travis, male   DOB: 06-May-1966, 52 y.o.   MRN: [id number]

## 2018-05-30 NOTE — Anesthesia Preprocedure Evaluation (Addendum)
Anesthesia Evaluation  Patient identified by MRN, date of birth, ID band Patient awake    Reviewed: Allergy & Precautions, NPO status , Patient's Chart, lab work & pertinent test results, reviewed documented beta blocker date and time   Airway Mallampati: II  TM Distance: >3 FB Neck ROM: Full    Dental no notable dental hx. (+) Teeth Intact, Dental Advisory Given   Pulmonary neg pulmonary ROS,    Pulmonary exam normal        Cardiovascular hypertension, Pt. on medications and Pt. on home beta blockers Normal cardiovascular exam+ dysrhythmias Supra Ventricular Tachycardia  Rhythm:Regular Rate:Normal  Controlled on Flecanide EKG- NSR, LAFB   Neuro/Psych negative neurological ROS  negative psych ROS   GI/Hepatic negative GI ROS, Neg liver ROS,   Endo/Other  Morbid obesity  Renal/GU negative Renal ROS  negative genitourinary   Musculoskeletal Torn Medial meniscus right knee   Abdominal (+) + obese,   Peds  Hematology negative hematology ROS (+)   Anesthesia Other Findings   Reproductive/Obstetrics                          Anesthesia Physical Anesthesia Plan  ASA: III  Anesthesia Plan: General   Post-op Pain Management:    Induction: Intravenous  PONV Risk Score and Plan: 3 and Midazolam, Dexamethasone, Ondansetron and Treatment may vary due to age or medical condition  Airway Management Planned: Oral ETT and LMA  Additional Equipment:   Intra-op Plan:   Post-operative Plan: Extubation in OR  Informed Consent: I have reviewed the patients History and Physical, chart, labs and discussed the procedure including the risks, benefits and alternatives for the proposed anesthesia with the patient or authorized representative who has indicated his/her understanding and acceptance.   Dental advisory given  Plan Discussed with: CRNA, Anesthesiologist and Surgeon  Anesthesia Plan Comments:          Anesthesia Quick Evaluation

## 2018-05-30 NOTE — Op Note (Signed)
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  POSTOPERATIVE DIAGNOSIS:  Tricompartmental arthritis, right knee with tear posterior horn of the medial meniscus and medial shelf plica.  PROCEDURE: 1.  Diagnostic arthroscopy, right knee. 2.  Partial medial meniscectomy. 3.  Plicectomy.   4.  Chondroplasty of patella.  SURGEON:  Joni Fears, MD.    ASSISTANT:  Sharman Crate, PA-C.  ANESTHESIA:  General laryngeal with local infiltration of 0.25% Marcaine with epinephrine.  ESTIMATED BLOOD LOSS:  None.  HISTORY:  A 52 year old gentleman experienced insidious onset of right knee pain over the last several months.  He has reached a point where he has significant compromise of his activities, particularly with weightbearing.  He has had therapy, cortisone  injection, and anti-inflammatory medicines with persistent pain predominantly along the medial compartment.  MRI scan reveals some degenerative changes in all 3 compartments predominantly at the patellofemoral joint.  Because of his poor response to  conservative treatment, he is now to have an arthroscopic procedure.  DESCRIPTION OF PROCEDURE:  The patient was met with his wife in the holding area and identified the right knee as a proper operative site and marked it accordingly.  The patient was then transported to room #7 and carefully placed on the operating room  table.  Anesthesia performed general laryngeal anesthesia without difficulty.  The right lower extremity was then placed in a thigh holder.  Leg was prepped with chlorhexidine scrub and DuraPrep from the tourniquet to the tips of the toes.  Sterile draping was performed.  Timeout was called.  I  infiltrated the area of arthroscopic portals with 0.25% Marcaine with epinephrine.  Small stab wounds were then made on either side of the patella.  The arthroscope was initially placed into the lateral portal.  There was minimal clear yellow joint  effusion.  There was also minimal synovitis.  There was diffuse synovitis of the patella representing grade II and III changes.  There were loose or areas of articular cartilage on both medial and lateral facets.  I did not see any appreciable medial or  lateral tilt.  There was a large medial shelf plica.  I used the Cuda shaver to remove any loose articular cartilage from the patella and the Arthrocare wand to debride the medial shelf plica.  In the lateral compartment, there was some grade I and early grade II changes of chondromalacia in both the femur and the tibia.  The meniscus was carefully probed and intact.  ACL and PCL remained intact.  There were no loose bodies.  In the medial  compartment, there was an obvious complex tear of the medial meniscus in its posterior third, the represented radial tears and horizontal cleavage tears.  Using a series of basket forceps, Cuda shaver and the Arthrocare wand, the meniscus was debrided  back to stable meniscal rim.  The entire tear was in the white-white zone.  There were areas of grade II and early grade III changes of chondromalacia without exposed subchondral bone in both femur and tibia.  I then carefully probed the remaining medial meniscus without evidence of further unstable meniscal tissue.  The joint  was irrigated with saline solution.  We infiltrated the arthroscopic portals with 0.25% Marcaine with epinephrine.  Sterile bulky dressing was applied followed by an Ace bandage.  PLAN:  Discharge as an outpatient.  Oxycodone for pain.  Crutches.  Office in 1 week.  TN/NUANCE  D:05/30/2018 T:05/30/2018 JOB:[id number]

## 2018-05-30 NOTE — Transfer of Care (Signed)
Immediate Anesthesia Transfer of Care Note  Patient: Alexander Travis  Procedure(s) Performed: RIGHT KNEE ARTHROSCOPY WITH MEDIAL MENISECTOMY (Right Knee)  Patient Location: PACU  Anesthesia Type:General  Level of Consciousness: awake, alert , oriented, drowsy and patient cooperative  Airway & Oxygen Therapy: Patient Spontanous Breathing and Patient connected to nasal cannula oxygen  Post-op Assessment: Report given to RN and Post -op Vital signs reviewed and stable  Post vital signs: Reviewed and stable  Last Vitals:  Vitals Value Taken Time  BP    Temp    Pulse    Resp    SpO2      Last Pain:  Vitals:   05/30/18 0827  TempSrc:   PainSc: 2       Patients Stated Pain Goal: 3 (73/66/81 5947)  Complications: No apparent anesthesia complications

## 2018-05-30 NOTE — Anesthesia Postprocedure Evaluation (Signed)
Anesthesia Post Note  Patient: Alexander Travis  Procedure(s) Performed: RIGHT KNEE ARTHROSCOPY WITH MEDIAL MENISECTOMY (Right Knee)     Patient location during evaluation: PACU Anesthesia Type: General Level of consciousness: awake and alert and oriented Pain management: pain level controlled Vital Signs Assessment: post-procedure vital signs reviewed and stable Respiratory status: spontaneous breathing, nonlabored ventilation and respiratory function stable Cardiovascular status: blood pressure returned to baseline and stable Postop Assessment: no apparent nausea or vomiting Anesthetic complications: no    Last Vitals:  Vitals:   05/30/18 1200 05/30/18 1230  BP: 127/80 129/85  Pulse: 62 63  Resp:    Temp: 36.7 C   SpO2: 100% 100%    Last Pain:  Vitals:   05/30/18 1230  TempSrc:   PainSc: 3         RLE Motor Response: Purposeful movement (05/30/18 1230)        Livvy Spilman A.

## 2018-05-30 NOTE — Progress Notes (Addendum)
PATIENT ID:      Alexander Travis  MRN:     [id number] DOB/AGE:    February 11, 1966 / 52 y.o.       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   Weight as of this encounter: 310 lb (140.6 kg).     POSTOPERATIVE DIAGNOSIS:   TRICOMPARTMENTAL ARTHRITIS RIGHT KNEE WITH TEAR MEDIAL MENISCUS, MEDIAL SHELF PLICA                                                                  Estimated body mass index is 39.8 kg/m as calculated from the following:   Height as of 05/04/18: 6\' 2"  (1.88 m).   Weight as of this encounter: 310 lb (140.6 kg).     PROCEDURE:  Procedure(s): RIGHT KNEE ARTHROSCOPY WITH PARTIAL MEDIAL MENISECTOMY ,PLICECTOMY,CHONDROPLASTY MEDIAL FEMORAL CONDYLE AND PATELLA     SURGEON:  Joni Fears, MD    ASSISTANT:   Tawanna Cooler PA-C   (Present and scrubbed throughout the case, critical for assistance with exposure, retraction, instrumentation, and closure.)          ANESTHESIA: general     DRAINS: none :      TOURNIQUET TIME: * No tourniquets in log *    COMPLICATIONS:  None   CONDITION:  stable  PROCEDURE IN DETAIL: [id number]   Alexander Travis 05/30/2018, 11:13 AM  Patient ID: Alexander Travis, male   DOB: [birth year], 52 y.o.   MRN: [id number]

## 2018-05-31 ENCOUNTER — Encounter (HOSPITAL_COMMUNITY): Payer: Self-pay | Admitting: Orthopaedic Surgery

## 2018-06-05 ENCOUNTER — Encounter (INDEPENDENT_AMBULATORY_CARE_PROVIDER_SITE_OTHER): Payer: Self-pay | Admitting: Orthopaedic Surgery

## 2018-06-05 ENCOUNTER — Ambulatory Visit (INDEPENDENT_AMBULATORY_CARE_PROVIDER_SITE_OTHER): Payer: BLUE CROSS/BLUE SHIELD | Admitting: Orthopaedic Surgery

## 2018-06-05 VITALS — BP 125/86 | HR 75 | Ht 74.0 in | Wt 310.0 lb

## 2018-06-05 DIAGNOSIS — G8929 Other chronic pain: Secondary | ICD-10-CM

## 2018-06-05 DIAGNOSIS — M25561 Pain in right knee: Secondary | ICD-10-CM

## 2018-06-05 NOTE — Progress Notes (Signed)
   Office Visit Note   Patient: Alexander Travis           Date of Birth: Feb 16, 1966           MRN: [id number] Visit Date: 06/05/2018              Requested by: No referring provider defined for this encounter. PCP: Patient, No Pcp Per   Assessment & Plan: Visit Diagnoses:  1. Chronic pain of right knee     Plan: 6 days status post right knee arthroscopy.  Complex tear of the posterior horn of the medial meniscus which was debrided.  Some grade II and III chondromalacia.  Doing very well.  We will plan to see back in 1 month and return to work on Sunday, August 4  Follow-Up Instructions: No follow-ups on file.   Orders:  No orders of the defined types were placed in this encounter.  No orders of the defined types were placed in this encounter.     Procedures: No procedures performed   Clinical Data: No additional findings.   Subjective: Chief Complaint  Patient presents with  . Follow-up    05/02/18 R KNEE ARTHO  Relates no shortness of breath or chest pain.  No calf discomfort.  Ambulating without crutch or cane has not taken any pain pills but rather Tylenol  HPI  Review of Systems  Constitutional: Negative for fatigue and fever.  HENT: Negative for ear pain.   Eyes: Negative for pain.  Respiratory: Negative for cough and shortness of breath.   Cardiovascular: Negative for leg swelling.  Gastrointestinal: Negative for constipation and diarrhea.  Genitourinary: Negative for difficulty urinating.  Musculoskeletal: Negative for back pain and neck pain.  Skin: Negative for rash.  Allergic/Immunologic: Negative for food allergies.  Neurological: Negative for tremors and numbness.  Hematological: Does not bruise/bleed easily.  Psychiatric/Behavioral: Negative for sleep disturbance.     Objective: Vital Signs: BP 125/86 (BP Location: Left Arm, Patient Position: Sitting, Cuff Size: Normal)   Pulse 75   Ht 6\' 2"  (1.88 m)   Wt (!) 310 lb (140.6 kg)   BMI 39.80  kg/m   Physical Exam  Ortho Exam awake alert and oriented x3.  Comfortable sitting.  Arthroscopic portals right knee healing without problem.  Minimal effusion.  No calf pain.  Walks without a limp  Specialty Comments:  No specialty comments available.  Imaging: No results found.   PMFS History: There are no active problems to display for this patient.  Past Medical History:  Diagnosis Date  . PSVT (paroxysmal supraventricular tachycardia) ([place])     History reviewed. No pertinent family history.  Past Surgical History:  Procedure Laterality Date  . KIDNEY SURGERY    . KNEE ARTHROSCOPY WITH MEDIAL MENISECTOMY Right 05/30/2018   Procedure: RIGHT KNEE ARTHROSCOPY WITH MEDIAL MENISECTOMY;  Surgeon: Garald Balding, MD;  Location: [facility];  Service: Orthopedics;  Laterality: Right;  . lung biospy    . WRIST SURGERY     Social History   Occupational History  . Not on file  Tobacco Use  . Smoking status: Never Smoker  . Smokeless tobacco: Never Used  Substance and Sexual Activity  . Alcohol use: No    Frequency: Never  . Drug use: No  . Sexual activity: Not on file

## 2018-07-10 ENCOUNTER — Ambulatory Visit (INDEPENDENT_AMBULATORY_CARE_PROVIDER_SITE_OTHER): Payer: BLUE CROSS/BLUE SHIELD | Admitting: Orthopaedic Surgery

## 2018-07-10 ENCOUNTER — Encounter (INDEPENDENT_AMBULATORY_CARE_PROVIDER_SITE_OTHER): Payer: Self-pay | Admitting: Orthopaedic Surgery

## 2018-07-10 VITALS — BP 126/76 | HR 79 | Ht 74.0 in | Wt 310.0 lb

## 2018-07-10 DIAGNOSIS — M25561 Pain in right knee: Secondary | ICD-10-CM

## 2018-07-10 DIAGNOSIS — G8929 Other chronic pain: Secondary | ICD-10-CM

## 2018-07-10 NOTE — Progress Notes (Signed)
Office Visit Note   Patient: Alexander Travis           Date of Birth: Feb 04, 1966           MRN: [id number] Visit Date: 07/10/2018              Requested by: No referring provider defined for this encounter. PCP: Patient, No Pcp Per   Assessment & Plan: Visit Diagnoses:  1. Chronic pain of right knee     Plan: 5 weeks status post right knee arthroscopy with direct resection of a complex tear of the medial meniscus.  Also has some grade II and III chondromalacia.  Doing very well.  We will give him a note to return to work tomorrow without restriction.  Office 1 month  Follow-Up Instructions: Return in about 1 month (around 08/10/2018).   Orders:  No orders of the defined types were placed in this encounter.  No orders of the defined types were placed in this encounter.     Procedures: No procedures performed   Clinical Data: No additional findings.   Subjective: Chief Complaint  Patient presents with  . Follow-up    05/30/18 R KNEE ARTHRO, GOING GOOD. NO SWELLING JUST SOME STIFFNESS AND PAIN  Doing very well at present without any fever chills shortness of breath or chest pain.  Unable to return to work with any limitations.  He feels quite comfortable and capable of doing his regular work.  HPI  Review of Systems  Constitutional: Negative for fatigue and fever.  HENT: Negative for ear pain.   Eyes: Negative for pain.  Respiratory: Negative for cough and shortness of breath.   Cardiovascular: Negative for leg swelling.  Gastrointestinal: Negative for constipation and diarrhea.  Genitourinary: Negative for difficulty urinating.  Musculoskeletal: Negative for back pain and neck pain.  Skin: Negative for rash.  Allergic/Immunologic: Negative for food allergies.  Neurological: Positive for weakness. Negative for numbness.  Hematological: Does not bruise/bleed easily.  Psychiatric/Behavioral: Negative for sleep disturbance.     Objective: Vital Signs: BP 126/76  (BP Location: Right Arm, Patient Position: Sitting, Cuff Size: Normal)   Pulse 79   Ht 6\' 2"  (1.88 m)   Wt (!) 310 lb (140.6 kg)   BMI 39.80 kg/m   Physical Exam  Ortho Exam awake alert and oriented x3 comfortable sitting.  Walks without a limp.  Right knee looks benign.  No effusion.  No calf pain or popliteal fullness.  Arthroscopic portals intact.  No medial lateral joint pain.  Specialty Comments:  No specialty comments available.  Imaging: No results found.   PMFS History: There are no active problems to display for this patient.  Past Medical History:  Diagnosis Date  . PSVT (paroxysmal supraventricular tachycardia) ([place])     History reviewed. No pertinent family history.  Past Surgical History:  Procedure Laterality Date  . KIDNEY SURGERY    . KNEE ARTHROSCOPY WITH MEDIAL MENISECTOMY Right 05/30/2018   Procedure: RIGHT KNEE ARTHROSCOPY WITH MEDIAL MENISECTOMY;  Surgeon: Garald Balding, MD;  Location: [place];  Service: Orthopedics;  Laterality: Right;  . lung biospy    . WRIST SURGERY     Social History   Occupational History  . Not on file  Tobacco Use  . Smoking status: Never Smoker  . Smokeless tobacco: Never Used  Substance and Sexual Activity  . Alcohol use: No    Frequency: Never  . Drug use: No  . Sexual activity: Not on file

## 2018-08-07 ENCOUNTER — Ambulatory Visit (INDEPENDENT_AMBULATORY_CARE_PROVIDER_SITE_OTHER): Payer: BLUE CROSS/BLUE SHIELD | Admitting: Orthopaedic Surgery

## 2018-08-07 ENCOUNTER — Encounter (INDEPENDENT_AMBULATORY_CARE_PROVIDER_SITE_OTHER): Payer: Self-pay | Admitting: Orthopaedic Surgery

## 2018-08-07 VITALS — BP 131/81 | HR 72 | Ht 74.0 in | Wt 310.0 lb

## 2018-08-07 DIAGNOSIS — G8929 Other chronic pain: Secondary | ICD-10-CM

## 2018-08-07 DIAGNOSIS — M25561 Pain in right knee: Secondary | ICD-10-CM

## 2018-08-07 NOTE — Progress Notes (Signed)
Office Visit Note   Patient: Alexander Travis           Date of Birth: 09-16-1966           MRN: [id number] Visit Date: 08/07/2018              Requested by: No referring provider defined for this encounter. PCP: Patient, No Pcp Per   Assessment & Plan: Visit Diagnoses:  1. Chronic pain of right knee     Plan: Several month status post right knee arthroscopy with debridement of a large tear of the medial meniscus.  There were grade II and III chondromalacia changes in the medial compartment.  Has been back to work without problem working without restrictions.  Occasionally has some stiffness and soreness as expected with the arthritis.  Overall very happy.  Expect that over time he will has some arthritic symptoms  Follow-Up Instructions: Return if symptoms worsen or fail to improve.   Orders:  No orders of the defined types were placed in this encounter.  No orders of the defined types were placed in this encounter.     Procedures: No procedures performed   Clinical Data: No additional findings.   Subjective: Chief Complaint  Patient presents with  . Follow-up    R KNEE ARTHRO 05/30/18  PAIN DOING BETTER SINCE LAST VISIT 8/19, NO NUMBENSS OR WEAKNESS  Much better than he was before surgery.  Still having a bit of stiffness when he sits for long period of time  HPI  Review of Systems  Constitutional: Negative for fatigue and fever.  HENT: Negative for ear pain.   Eyes: Negative for pain.  Respiratory: Negative for cough and shortness of breath.   Cardiovascular: Positive for leg swelling.  Gastrointestinal: Negative for constipation and diarrhea.  Genitourinary: Negative for difficulty urinating.  Musculoskeletal: Negative for back pain and neck pain.  Skin: Negative for rash.  Allergic/Immunologic: Negative for food allergies.  Neurological: Negative for weakness and numbness.  Hematological: Does not bruise/bleed easily.  Psychiatric/Behavioral: Negative for  sleep disturbance.     Objective: Vital Signs: BP 131/81 (BP Location: Left Arm, Patient Position: Sitting, Cuff Size: Normal)   Pulse 72   Ht 6\' 2"  (1.88 m)   Wt (!) 310 lb (140.6 kg)   BMI 39.80 kg/m   Physical Exam  Ortho Exam awake alert and oriented x3.  Comfortable sitting.  Does not limp.  No effusion right knee.  Well-healed arthroscopic portals.  Mild medial joint discomfort.  Full extension and flexion compared to his left knee  Specialty Comments:  No specialty comments available.  Imaging: No results found.   PMFS History: There are no active problems to display for this patient.  Past Medical History:  Diagnosis Date  . PSVT (paroxysmal supraventricular tachycardia) ([place])     History reviewed. No pertinent family history.  Past Surgical History:  Procedure Laterality Date  . KIDNEY SURGERY    . KNEE ARTHROSCOPY WITH MEDIAL MENISECTOMY Right 05/30/2018   Procedure: RIGHT KNEE ARTHROSCOPY WITH MEDIAL MENISECTOMY;  Surgeon: Garald Balding, MD;  Location: [facility];  Service: Orthopedics;  Laterality: Right;  . lung biospy    . WRIST SURGERY     Social History   Occupational History  . Not on file  Tobacco Use  . Smoking status: Never Smoker  . Smokeless tobacco: Never Used  Substance and Sexual Activity  . Alcohol use: No    Frequency: Never  . Drug use: No  .  Sexual activity: Not on file

## 2018-09-07 ENCOUNTER — Ambulatory Visit (INDEPENDENT_AMBULATORY_CARE_PROVIDER_SITE_OTHER): Payer: BLUE CROSS/BLUE SHIELD | Admitting: Orthopaedic Surgery

## 2018-09-07 ENCOUNTER — Encounter (INDEPENDENT_AMBULATORY_CARE_PROVIDER_SITE_OTHER): Payer: Self-pay | Admitting: Orthopaedic Surgery

## 2018-09-07 ENCOUNTER — Ambulatory Visit (INDEPENDENT_AMBULATORY_CARE_PROVIDER_SITE_OTHER): Payer: Self-pay

## 2018-09-07 ENCOUNTER — Telehealth (INDEPENDENT_AMBULATORY_CARE_PROVIDER_SITE_OTHER): Payer: Self-pay | Admitting: *Deleted

## 2018-09-07 VITALS — BP 148/90 | HR 67 | Ht 74.0 in | Wt 310.0 lb

## 2018-09-07 DIAGNOSIS — M25561 Pain in right knee: Secondary | ICD-10-CM | POA: Diagnosis not present

## 2018-09-07 MED ORDER — BUPIVACAINE HCL 0.5 % IJ SOLN
2.0000 mL | INTRAMUSCULAR | Status: AC | PRN
  Administered 2018-09-07: 2 mL via INTRA_ARTICULAR

## 2018-09-07 MED ORDER — LIDOCAINE HCL 1 % IJ SOLN
2.0000 mL | INTRAMUSCULAR | Status: AC | PRN
  Administered 2018-09-07: 2 mL

## 2018-09-07 MED ORDER — METHYLPREDNISOLONE ACETATE 40 MG/ML IJ SUSP
80.0000 mg | INTRAMUSCULAR | Status: AC | PRN
  Administered 2018-09-07: 80 mg

## 2018-09-07 NOTE — Progress Notes (Signed)
Office Visit Note   Patient: Alexander Travis           Date of Birth: Apr 06, 1966           MRN: [id number] Visit Date: 09/07/2018              Requested by: No referring provider defined for this encounter. PCP: Patient, No Pcp Per   Assessment & Plan: Visit Diagnoses:  1. Acute pain of right knee     Plan: Recurrent effusion right knee related to the osteoarthritis.  Will aspirate and inject cortisone.  Long discussion regarding exercises and weight loss  Follow-Up Instructions: Return if symptoms worsen or fail to improve.   Orders:  Orders Placed This Encounter  Procedures  . Large Joint Inj: R knee  . XR Knee 1-2 Views Right   No orders of the defined types were placed in this encounter.     Procedures: Large Joint Inj: R knee on 09/07/2018 5:00 PM Indications: pain and diagnostic evaluation Details: 25 G 1.5 in needle, anteromedial approach  Arthrogram: No  Medications: 2 mL lidocaine 1 %; 2 mL bupivacaine 0.5 %; 80 mg methylPREDNISolone acetate 40 MG/ML Aspirate: 50 mL clear and yellow Outcome: tolerated well, no immediate complications Procedure, treatment alternatives, risks and benefits explained, specific risks discussed. Consent was given by the patient. Immediately prior to procedure a time out was called to verify the correct patient, procedure, equipment, support staff and site/side marked as required. Patient was prepped and draped in the usual sterile fashion.       Clinical Data: No additional findings.   Subjective: Chief Complaint  Patient presents with  . Follow-up    R KNEE PAIN INJURED 08/03/18 ON DOCK PLATE AT WORK SLIPPED AND HYPERFLEXED KNEE WHILE CATCHING HIS BALANCE  Recent right knee arthroscopy for a tear of the medial meniscus with excellent postoperative course.  Noted to have some arthritis at the time of surgery.  Head injury as above with recurrent effusion.  Knee feels heavy and tight.  Able to bear weight  HPI  Review of  Systems  Constitutional: Negative for fatigue and fever.  HENT: Negative for ear pain.   Eyes: Negative for pain.  Respiratory: Negative for cough and shortness of breath.   Cardiovascular: Positive for leg swelling.  Gastrointestinal: Negative for constipation and diarrhea.  Genitourinary: Negative for difficulty urinating.  Musculoskeletal: Negative for back pain and neck pain.  Skin: Negative for rash.  Allergic/Immunologic: Negative for food allergies.  Neurological: Positive for weakness. Negative for numbness.  Hematological: Does not bruise/bleed easily.  Psychiatric/Behavioral: Positive for sleep disturbance.     Objective: Vital Signs: BP (!) 148/90 (BP Location: Right Arm, Patient Position: Sitting, Cuff Size: Normal)   Pulse 67   Ht 6\' 2"  (1.88 m)   Wt (!) 310 lb (140.6 kg)   BMI 39.80 kg/m   Physical Exam  Constitutional: He is oriented to person, place, and time. He appears well-developed and well-nourished.  HENT:  Mouth/Throat: Oropharynx is clear and moist.  Eyes: Pupils are equal, round, and reactive to light. EOM are normal.  Pulmonary/Chest: Effort normal.  Neurological: He is alert and oriented to person, place, and time.  Skin: Skin is warm and dry.  Psychiatric: He has a normal mood and affect. His behavior is normal.    Ortho Exam right knee with positive effusion.  Knee was not hot or warm or red.  Mild tenderness along the medial compartment.  No patellar crepitation.  No instability.  No calf pain.  No popliteal discomfort.  Neurovascular exam intact.  Does have some venous stasis changes distally in the leg  Specialty Comments:  No specialty comments available.  Imaging: Xr Knee 1-2 Views Right  Result Date: 09/07/2018 Films of the right knee were obtained in several projections.  There is obvious narrowing of the medial compartment compared to the left.  Approximately 1 degree of varus.  No ectopic calcification.  No acute changes    PMFS  History: There are no active problems to display for this patient.  Past Medical History:  Diagnosis Date  . PSVT (paroxysmal supraventricular tachycardia) ([place])     History reviewed. No pertinent family history.  Past Surgical History:  Procedure Laterality Date  . KIDNEY SURGERY    . KNEE ARTHROSCOPY WITH MEDIAL MENISECTOMY Right 05/30/2018   Procedure: RIGHT KNEE ARTHROSCOPY WITH MEDIAL MENISECTOMY;  Surgeon: Garald Balding, MD;  Location: [facility];  Service: Orthopedics;  Laterality: Right;  . lung biospy    . WRIST SURGERY     Social History   Occupational History  . Not on file  Tobacco Use  . Smoking status: Never Smoker  . Smokeless tobacco: Never Used  Substance and Sexual Activity  . Alcohol use: No    Frequency: Never  . Drug use: No  . Sexual activity: Not on file

## 2018-09-07 NOTE — Telephone Encounter (Signed)
Please apply for visco for right knee for Alexander Travis. Thank you. 

## 2018-09-08 NOTE — Telephone Encounter (Signed)
Noted  

## 2018-09-28 ENCOUNTER — Telehealth (INDEPENDENT_AMBULATORY_CARE_PROVIDER_SITE_OTHER): Payer: Self-pay

## 2018-09-28 NOTE — Telephone Encounter (Signed)
Submitted VOB for Synvisc series, right knee. 

## 2018-10-02 ENCOUNTER — Telehealth (INDEPENDENT_AMBULATORY_CARE_PROVIDER_SITE_OTHER): Payer: Self-pay

## 2018-10-02 NOTE — Telephone Encounter (Signed)
I called patient 

## 2018-10-02 NOTE — Telephone Encounter (Signed)
Received VOB for Synvisc series, right knee stating that product is not covered under patient's insurance.  Patient has Alexander Travis of [place].  Please advise.  Thank You.

## 2018-10-06 DIAGNOSIS — Z85528 Personal history of other malignant neoplasm of kidney: Secondary | ICD-10-CM | POA: Diagnosis not present

## 2018-10-11 ENCOUNTER — Ambulatory Visit (INDEPENDENT_AMBULATORY_CARE_PROVIDER_SITE_OTHER): Payer: BLUE CROSS/BLUE SHIELD | Admitting: Cardiology

## 2018-10-11 ENCOUNTER — Encounter: Payer: Self-pay | Admitting: Cardiology

## 2018-10-11 VITALS — BP 120/86 | HR 69 | Ht 74.0 in | Wt 313.8 lb

## 2018-10-11 DIAGNOSIS — I471 Supraventricular tachycardia: Secondary | ICD-10-CM

## 2018-10-11 DIAGNOSIS — I872 Venous insufficiency (chronic) (peripheral): Secondary | ICD-10-CM

## 2018-10-11 HISTORY — DX: Venous insufficiency (chronic) (peripheral): I87.2

## 2018-10-11 NOTE — Addendum Note (Signed)
Addended by: Austin Miles on: 10/11/2018 03:25 PM   Modules accepted: Orders

## 2018-10-11 NOTE — Progress Notes (Signed)
Cardiology Office Note:    Date:  10/11/2018   ID:  Alexander Travis, DOB 07-05-1966, MRN [id number]  PCP:  Patient, No Pcp Per  Cardiologist:  Shirlee More, MD    Referring MD: No ref. provider found    ASSESSMENT:    1. PSVT (paroxysmal supraventricular tachycardia) ([facility])   2. Venous stasis dermatitis of both lower extremities    PLAN:    In order of problems listed above:  1. Stable he will continue current treatment flecainide beta-blocker check level may need dose adjustment 2. Stable continue treatment support hose and cream from his dermatologist   Next appointment: One year   Medication Adjustments/Labs and Tests Ordered: Current medicines are reviewed at length with the patient today.  Concerns regarding medicines are outlined above.  No orders of the defined types were placed in this encounter.  No orders of the defined types were placed in this encounter.   No chief complaint on file.   History of Present Illness:    Alexander Travis is a 52 y.o. male with a hx of SVT treated with flecainide last seen May 2018. Compliance with diet, lifestyle and medications: Yes  Overall is done well except for 1 week last December we have been on tachycardia without an obvious precipitant.  He takes no over-the-counter proarrhythmic drugs and has had no recurrence.  He tolerates flecainide without side effects dose is low we will check a level and he may require a higher dose to achieve therapeutic blood level.  For now we continue current treatment beta-blocker flecainide and if he fails clinically he would do well with EP guided procedures no chest pain palpitations syncope shortness of breath.  He has varicosities and stasis changes lower extremities and seen a dermatologist and was treated with a cream he also uses support hose Past Medical History:  Diagnosis Date  . Palpitation 11/19/2015   Overview:  Seen in ED 11/11/15 without arrhythmia, CBC and CMP were normal  . PSVT  (paroxysmal supraventricular tachycardia) ([place])     Past Surgical History:  Procedure Laterality Date  . KIDNEY SURGERY    . KNEE ARTHROSCOPY WITH MEDIAL MENISECTOMY Right 05/30/2018   Procedure: RIGHT KNEE ARTHROSCOPY WITH MEDIAL MENISECTOMY;  Surgeon: Garald Balding, MD;  Location: [facility];  Service: Orthopedics;  Laterality: Right;  . lung biospy    . WRIST SURGERY      Current Medications: Current Meds  Medication Sig  . ammonium lactate (LAC-HYDRIN) 12 % lotion Apply 1 application topically 2 (two) times daily.  . flecainide (TAMBOCOR) 50 MG tablet TAKE 1 TABLET BY MOUTH TWICE A DAY  . metoprolol tartrate (LOPRESSOR) 25 MG tablet Take 25 mg by mouth 2 (two) times daily.      Allergies:   Codeine   Social History   Socioeconomic History  . Marital status: Single    Spouse name: Not on file  . Number of children: Not on file  . Years of education: Not on file  . Highest education level: Not on file  Occupational History  . Not on file  Social Needs  . Financial resource strain: Not on file  . Food insecurity:    Worry: Not on file    Inability: Not on file  . Transportation needs:    Medical: Not on file    Non-medical: Not on file  Tobacco Use  . Smoking status: Never Smoker  . Smokeless tobacco: Never Used  Substance and Sexual Activity  . Alcohol use:  No    Frequency: Never  . Drug use: No  . Sexual activity: Not on file  Lifestyle  . Physical activity:    Days per week: Not on file    Minutes per session: Not on file  . Stress: Not on file  Relationships  . Social connections:    Talks on phone: Not on file    Gets together: Not on file    Attends religious service: Not on file    Active member of club or organization: Not on file    Attends meetings of clubs or organizations: Not on file    Relationship status: Not on file  Other Topics Concern  . Not on file  Social History Narrative  . Not on file     Family History: The patient's family  history includes Heart disease in his father and mother. ROS:   Please see the history of present illness.    All other systems reviewed and are negative.  EKGs/Labs/Other Studies Reviewed:    The following studies were reviewed today:  EKG:  EKG ordered today.  The ekg ordered today demonstrates sinus rhythm with normal  Recent Labs: 05/30/2018: BUN 12; Creatinine, Ser 1.37; Hemoglobin 15.3; Platelets 226; Potassium 4.0; Sodium 140  Recent Lipid Panel No results found for: CHOL, TRIG, HDL, CHOLHDL, VLDL, LDLCALC, LDLDIRECT  Physical Exam:    VS:  BP 120/86 (BP Location: Right Arm, Patient Position: Sitting, Cuff Size: Large)   Pulse 69   Ht 6\' 2"  (1.88 m)   Wt (!) 313 lb 12.8 oz (142.3 kg)   SpO2 97%   BMI 40.29 kg/m     Wt Readings from Last 3 Encounters:  10/11/18 (!) 313 lb 12.8 oz (142.3 kg)  09/07/18 (!) 310 lb (140.6 kg)  08/07/18 (!) 310 lb (140.6 kg)     GEN:  Well nourished, well developed in no acute distress HEENT: Normal NECK: No JVD; No carotid bruits LYMPHATICS: No lymphadenopathy CARDIAC: RRR, no murmurs, rubs, gallops RESPIRATORY:  Clear to auscultation without rales, wheezing or rhonchi  ABDOMEN: Soft, non-tender, non-distended MUSCULOSKELETAL:  No edema; No deformity  SKIN: Warm and dry NEUROLOGIC:  Alert and oriented x 3 PSYCHIATRIC:  Normal affect    Signed, Shirlee More, MD  10/11/2018 3:10 PM    [facility]

## 2018-10-11 NOTE — Patient Instructions (Signed)
Medication Instructions:  Your physician recommends that you continue on your current medications as directed. Please refer to the Current Medication list given to you today.  If you need a refill on your cardiac medications before your next appointment, please call your pharmacy.   Lab work: Your physician recommends that you return for lab work today: BMP, flecainide level.   If you have labs (blood work) drawn today and your tests are completely normal, you will receive your results only by: Marland Kitchen MyChart Message (if you have MyChart) OR . A paper copy in the mail If you have any lab test that is abnormal or we need to change your treatment, we will call you to review the results.  Testing/Procedures: You had an EKG today.   Follow-Up: At [facility], you and your health needs are our priority.  As part of our continuing mission to provide you with exceptional heart care, we have created designated Provider Care Teams.  These Care Teams include your primary Cardiologist (physician) and Advanced Practice Providers (APPs -  Physician Assistants and Nurse Practitioners) who all work together to provide you with the care you need, when you need it. You will need a follow up appointment in 12 months.  Please call our office 2 months in advance to schedule this appointment.

## 2018-10-17 LAB — BASIC METABOLIC PANEL
BUN / CREAT RATIO: 10 (ref 9–20)
BUN: 12 mg/dL (ref 6–24)
CO2: 23 mmol/L (ref 20–29)
CREATININE: 1.24 mg/dL (ref 0.76–1.27)
Calcium: 10.3 mg/dL — ABNORMAL HIGH (ref 8.7–10.2)
Chloride: 98 mmol/L (ref 96–106)
GFR calc Af Amer: 76 mL/min/{1.73_m2} (ref >59)
GFR calc non Af Amer: 65 mL/min/{1.73_m2} (ref >59)
GLUCOSE: 83 mg/dL (ref 65–99)
Potassium: 4.8 mmol/L (ref 3.5–5.2)
SODIUM: 142 mmol/L (ref 134–144)

## 2018-10-17 LAB — FLECAINIDE LEVEL: FLECAINIDE: 0.2 ug/mL (ref 0.20–1.00)

## 2018-12-18 ENCOUNTER — Telehealth: Payer: Self-pay | Admitting: Cardiology

## 2018-12-18 NOTE — Telephone Encounter (Signed)
°  1. Which medications need to be refilled? (please list name of each medication and dose if known) Metoprolol tartrate 25mg  tablet twice a day  2. Which pharmacy/location (including street and city if local pharmacy) is medication to be sent to?[facility] [place]  3. Do they need a 30 day or 90 day supply? [place]

## 2018-12-19 ENCOUNTER — Other Ambulatory Visit: Payer: Self-pay

## 2018-12-19 DIAGNOSIS — I471 Supraventricular tachycardia: Secondary | ICD-10-CM

## 2018-12-19 DIAGNOSIS — I872 Venous insufficiency (chronic) (peripheral): Secondary | ICD-10-CM

## 2018-12-19 MED ORDER — METOPROLOL TARTRATE 25 MG PO TABS: 25.0000 mg | ORAL_TABLET | Freq: Two times a day (BID) | ORAL | 1 refills | Status: DC

## 2018-12-19 NOTE — Telephone Encounter (Signed)
Medication sent to pharmacy  

## 2019-02-28 ENCOUNTER — Encounter: Payer: Self-pay | Admitting: Cardiology

## 2019-02-28 ENCOUNTER — Telehealth: Payer: Self-pay | Admitting: Cardiology

## 2019-02-28 NOTE — Telephone Encounter (Signed)
Patient is having "really bad PVCs" and pain in his left shoulder  Please call patient to determine next steps  [phone]

## 2019-02-28 NOTE — Telephone Encounter (Signed)
RN pulled patient records from [facility] for Alexander Travis to review.RN called patient and received phone consent for telemedicine visit. Patient instructed to have home BP cuff available, weigh himself and all medication out for reconciliation/refills. Patient instructed if arm pain worsens or he starts having any other symptoms to seek immediate medical attention.Pt scheduled for virtual visit and had no further questions.

## 2019-03-05 ENCOUNTER — Encounter: Payer: Self-pay | Admitting: Cardiology

## 2019-03-05 ENCOUNTER — Telehealth (INDEPENDENT_AMBULATORY_CARE_PROVIDER_SITE_OTHER): Payer: BLUE CROSS/BLUE SHIELD | Admitting: Cardiology

## 2019-03-05 VITALS — BP 126/85 | HR 62 | Ht 74.0 in | Wt 315.0 lb

## 2019-03-05 DIAGNOSIS — Z5181 Encounter for therapeutic drug level monitoring: Secondary | ICD-10-CM

## 2019-03-05 DIAGNOSIS — G4733 Obstructive sleep apnea (adult) (pediatric): Secondary | ICD-10-CM | POA: Insufficient documentation

## 2019-03-05 DIAGNOSIS — R002 Palpitations: Secondary | ICD-10-CM | POA: Diagnosis not present

## 2019-03-05 DIAGNOSIS — G4730 Sleep apnea, unspecified: Secondary | ICD-10-CM | POA: Insufficient documentation

## 2019-03-05 DIAGNOSIS — I493 Ventricular premature depolarization: Secondary | ICD-10-CM

## 2019-03-05 DIAGNOSIS — I471 Supraventricular tachycardia: Secondary | ICD-10-CM

## 2019-03-05 DIAGNOSIS — Z13220 Encounter for screening for lipoid disorders: Secondary | ICD-10-CM

## 2019-03-05 DIAGNOSIS — Z79899 Other long term (current) drug therapy: Secondary | ICD-10-CM

## 2019-03-05 MED ORDER — FLECAINIDE ACETATE 50 MG PO TABS: ORAL_TABLET | ORAL | 1 refills | Status: DC

## 2019-03-05 NOTE — Addendum Note (Signed)
Addended by: Austin Miles on: 03/05/2019 09:18 AM   Modules accepted: Orders

## 2019-03-05 NOTE — Patient Instructions (Addendum)
Medication Instructions:  Your physician has recommended you make the following change in your medication:   INCREASE flecainide (tambocor) 50 mg: Take 2 tablets (100 mg) in the morning and 1 tablet (50 mg) in the evening  If you need a refill on your cardiac medications before your next appointment, please call your pharmacy.   Lab work: Your physician recommends that you return for lab work in 2 weeks: flecainide level, lipid panel. You can go to our our [facility] or [place] office or the nearest [place] for lab work, no appointment needed. Please fast beforehand.   If you have labs (blood work) drawn today and your tests are completely normal, you will receive your results only by: Marland Kitchen MyChart Message (if you have MyChart) OR . A paper copy in the mail If you have any lab test that is abnormal or we need to change your treatment, we will call you to review the results.  Testing/Procedures: None  Follow-Up: At [facility], you and your health needs are our priority.  As part of our continuing mission to provide you with exceptional heart care, we have created designated Provider Care Teams.  These Care Teams include your primary Cardiologist (physician) and Advanced Practice Providers (APPs -  Physician Assistants and Nurse Practitioners) who all work together to provide you with the care you need, when you need it. You will need a follow up virtual appointment (Doxy.me) in 3 months: Thursday, 05/24/2019, at 10:00 am.        1. Avoid all over-the-counter antihistamines except Claritin/Loratadine and Zyrtec/Cetrizine. 2. Avoid all combination including cold sinus allergies flu decongestant and sleep medications 3. You can use Robitussin DM Mucinex and Mucinex DM for cough. 4. can use Tylenol aspirin ibuprofen and naproxen but no combinations such as sleep or sinus.1. Avoid all over-the-counter antihistamines except Claritin/Loratadine and Zyrtec/Cetrizine. 2. Avoid all combination  including cold sinus allergies flu decongestant and sleep medications 3. You can use Robitussin DM Mucinex and Mucinex DM for cough. 4. can use Tylenol aspirin ibuprofen and naproxen but no combinations such as sleep or sinus.  KardiaMobile [email or web address]        FDA-cleared, clinical grade mobile EKG monitor: Jodelle Red is the most clinically-validated mobile EKG used by the world's leading cardiac care medical professionals With Basic service, know instantly if your heart rhythm is normal or if atrial fibrillation is detected, and email the last single EKG recording to yourself or your doctor Premium service, available for purchase through the Kardia app for $9.99 per month or $99 per year, includes unlimited history and storage of your EKG recordings, a monthly EKG summary report to share with your doctor, along with the ability to track your blood pressure, activity and weight Includes one KardiaMobile phone clip FREE SHIPPING: Standard delivery 1-3 business days. Orders placed by 11:00am PST will ship that afternoon. Otherwise, will ship next business day. All orders ship via [employer] from [place], [place]

## 2019-03-05 NOTE — Progress Notes (Signed)
Virtual Visit via Video Note   This visit type was conducted due to national recommendations for restrictions regarding the COVID-19 Pandemic (e.g. social distancing) in an effort to limit this patient's exposure and mitigate transmission in our community.  Due to his co-morbid illnesses, this patient is at least at moderate risk for complications without adequate follow up.  This format is felt to be most appropriate for this patient at this time.  All issues noted in this document were discussed and addressed.  A limited physical exam was performed with this format.  Please refer to the patient's chart for his consent to telehealth for [facility].   Evaluation Performed:  Follow-up visit  Date:  03/05/2019   ID:  Alexander Travis, DOB 10-20-1966, MRN [id number]  Patient Location: Home Provider Location: Other:  [facility] [place] [facility]  PCP:  Patient, No Pcp Per  Cardiologist:  No primary care provider on file. Dr Bettina Gavia Electrophysiologist:  None   Chief Complaint:  Palpitation recent ED visit at [facility]  History of Present Illness:    Alexander Travis is a 53 y.o. male with paroxysmal SVT who is been treated with flecainide and was last seen 10/11/2018.  At that time he had done well and had no sustained arrhythmia.  Flecainide level was borderline low at 0.20.  He was in [facility] emergency department 02/14/2019 with complaints of palpitation.  On presentation blood pressure was 168/88 pulse 76 and he had no arrhythmia documented during the ED visit.  Testing performed included a normal CBC drug screen was normal proBNP level was normal d-dimer was low troponin undetectable potassium 4.2 magnesium 2.1.  His EKG in the emergency room is personally reviewed by me today showed sinus rhythm left axis deviation left anterior hemiblock.  The patient does not have symptoms concerning for COVID-19 infection (fever, chills, cough, or new shortness of breath).   He relates that he had a  particular flare palpitation the day went to the hospital lasted off and on all day he was lightheaded with it they called the ambulance and he tells me the EMS crew did strips and document the presence of PVCs.  Unfortunately this is not noted or included in the emergency room records.  He has good healthcare literacy he is compliant with his medications but said that recently he was given a different generic tablet his blood level previously was low normal and his dose may be an adequate for body size.  Also interesting around the time he had an influenza-like illness he had fever cough some shortness of breath and he was somewhat surprised he was not checked for COVID-19 in the emergency room.  This is all resolved and he has some intermittent minor infrequent momentary palpitation.  No shortness of breath edema orthopnea chest pain or syncope.  Dizziness has not recurred  Past Medical History:  Diagnosis Date  . Palpitation 11/19/2015   Overview:  Seen in ED 11/11/15 without arrhythmia, CBC and CMP were normal  . PSVT (paroxysmal supraventricular tachycardia) ([place])   Is seen at [facility] emergency department 02/13/2018 with palpitation.  He was in sinus rhythm on presentation heart rate 63 blood pressure 110/66 and he had no arrhythmia during the hospital visit. Past Surgical History:  Procedure Laterality Date  . KIDNEY SURGERY    . KNEE ARTHROSCOPY WITH MEDIAL MENISECTOMY Right 05/30/2018   Procedure: RIGHT KNEE ARTHROSCOPY WITH MEDIAL MENISECTOMY;  Surgeon: Garald Balding, MD;  Location: [place];  Service: Orthopedics;  Laterality: Right;  . lung biospy    . WRIST SURGERY       Current Meds  Medication Sig  . flecainide (TAMBOCOR) 50 MG tablet TAKE 1 TABLET BY MOUTH TWICE A DAY  . metoprolol tartrate (LOPRESSOR) 25 MG tablet Take 1 tablet (25 mg total) by mouth 2 (two) times daily.     Allergies:   Codeine   Social History   Tobacco Use  . Smoking status: Never Smoker  .  Smokeless tobacco: Never Used  Substance Use Topics  . Alcohol use: No    Frequency: Never  . Drug use: No     Family Hx: The patient's family history includes Heart disease in his father and mother.  ROS:   Please see the history of present illness.     All other systems reviewed and are negative.   Prior CV studies:   The following studies were reviewed today:  EKG from [facility] ED was personally reviewed by me QT interval is normal left axis deviation left anterior hemiblock sinus rhythm  Labs/Other Tests and Data Reviewed:      Recent Labs: 05/30/2018: Hemoglobin 15.3; Platelets 226 10/11/2018: BUN 12; Creatinine, Ser 1.24; Potassium 4.8; Sodium 142   Recent Lipid Panel No results found for: CHOL, TRIG, HDL, CHOLHDL, LDLCALC, LDLDIRECT  Wt Readings from Last 3 Encounters:  03/05/19 (!) 315 lb (142.9 kg)  10/11/18 (!) 313 lb 12.8 oz (142.3 kg)  09/07/18 (!) 310 lb (140.6 kg)     Objective:    Vital Signs:  BP 126/85 (BP Location: Right Arm, Patient Position: Sitting)   Pulse 62   Ht 6\' 2"  (1.88 m)   Wt (!) 315 lb (142.9 kg)   BMI 40.44 kg/m    VITAL SIGNS:  reviewed GEN:  no acute distress EYES:  sclerae anicteric, EOMI - Extraocular Movements Intact RESPIRATORY:  normal respiratory effort, symmetric expansion CARDIOVASCULAR:  no peripheral edema SKIN:  no rash, lesions or ulcers. MUSCULOSKELETAL:  no obvious deformities. NEURO:  alert and oriented x 3, no obvious focal deficit PSYCH:  normal affect  ASSESSMENT & PLAN:    1. Symptomatic PVCs recent flare may be associated with an adequate dose from generic alteration we will increase his morning flecainide 100 mg check a blood level in 2 weeks's request also check a lipid profile at that time.  Avoid over-the-counter proarrhythmic drugs and I asked him to purchase the iPhone adapter to monitor his rhythm at home and contact me if he is having frequent or repetitive PVCs he will continue to avoid  over-the-counter proarrhythmic drugs 2. SVT stable no recurrence 3. Sleep apnea poorly controlled not treated and worsened with his morbid obesity BMI greater than 40 when seen by me in the office in follow-up 3 months will address issues including treatment of sleep apnea and perhaps referral for dietary and even discussed bariatric surgery  COVID-19 Education: The signs and symptoms of COVID-19 were discussed with the patient and how to seek care for testing (follow up with PCP or arrange E-visit).  The importance of social distancing was discussed today.  Time:   Today, I have spent 27 minutes with the patient with telehealth technology discussing the above problems.     Medication Adjustments/Labs and Tests Ordered: Current medicines are reviewed at length with the patient today.  Concerns regarding medicines are outlined above.   Tests Ordered: No orders of the defined types were placed in this encounter.   Medication Changes: No orders  of the defined types were placed in this encounter.   Disposition:  Follow up July 2020  SignedShirlee More, MD  03/05/2019 8:54 AM    [facility]

## 2019-03-20 ENCOUNTER — Telehealth: Payer: Self-pay | Admitting: *Deleted

## 2019-03-20 NOTE — Telephone Encounter (Signed)
Spoke with patient and informed him Dr. Bettina Gavia is unaware of this diet and unable to advise on the diet. Patient verbalized understanding. No further questions.

## 2019-03-20 NOTE — Telephone Encounter (Signed)
-----   Message from Richardo Priest, MD sent at 03/19/2019  3:49 PM EDT ----- Regarding: RE: Optavia diet I really do not have any specific knowledge of this diet, is this something that he accessed in his lung was approved prescribed by provider and not sure I can give him the counseling he is looking for ----- Message ----- From: Susie Cassette, RN Sent: 03/19/2019   3:32 PM EDT To: Richardo Priest, MD Subject: [place]                                   Patient wants to discuss the diet Optavia with you before he starts. He had come for labs today but could not wait at the time to discuss between patients. Thanks

## 2019-03-21 LAB — LIPID PANEL
Chol/HDL Ratio: 4.6 ratio (ref 0.0–5.0)
Cholesterol, Total: 181 mg/dL (ref 100–199)
HDL: 39 mg/dL — ABNORMAL LOW (ref >39)
LDL Calculated: 113 mg/dL — ABNORMAL HIGH (ref 0–99)
Triglycerides: 145 mg/dL (ref 0–149)
VLDL Cholesterol Cal: 29 mg/dL (ref 5–40)

## 2019-03-21 LAB — FLECAINIDE LEVEL: Flecainide: 0.31 ug/mL (ref 0.20–1.00)

## 2019-05-23 NOTE — Progress Notes (Signed)
Cardiology Office Note:    Date:  05/24/2019   ID:  Alexander Travis, DOB [birth year], MRN [id number]  PCP:  Patient, No Pcp Per  Cardiologist:  Shirlee More, MD    Referring MD: No ref. provider found    ASSESSMENT:    1. PSVT (paroxysmal supraventricular tachycardia) ([place])   2. PVC (premature ventricular contraction)    PLAN:    In order of problems listed above:  1. He presently is asymptomatic and has reduced his dose of flecainide and attributes much of this to retirement and increase physical activity.  He will continue low-dose beta-blocker low-dose flecainide avoid over-the-counter proarrhythmic drugs he has a device to record heart rhythm off his iPhone and if he has a breakthrough episode I will see him sooner but plan to see him back in the office in 1 year.   Next appointment: 1 yr   Medication Adjustments/Labs and Tests Ordered: Current medicines are reviewed at length with the patient today.  Concerns regarding medicines are outlined above.  No orders of the defined types were placed in this encounter.  No orders of the defined types were placed in this encounter.   No chief complaint on file.   History of Present Illness:    Alexander Travis is a 53 y.o. male with a hx of paroxysmal SVT who is been treated with flecainide  last seen 03/05/2019.Marland Kitchen Compliance with diet, lifestyle and medications: Yes he was doing so well he reduced his dose of flecainide  He has had no recurrent episodes of palpitation chest pain shortness of breath or syncope.  He has physical findings of stasis dermatitis and I discussed vein hygiene with emollient elevation and support hose Past Medical History:  Diagnosis Date  . Palpitation 11/19/2015   Overview:  Seen in ED 11/11/15 without arrhythmia, CBC and CMP were normal  . PSVT (paroxysmal supraventricular tachycardia) ([place])     Past Surgical History:  Procedure Laterality Date  . KIDNEY SURGERY    . KNEE ARTHROSCOPY WITH MEDIAL  MENISECTOMY Right 05/30/2018   Procedure: RIGHT KNEE ARTHROSCOPY WITH MEDIAL MENISECTOMY;  Surgeon: Garald Balding, MD;  Location: [place];  Service: Orthopedics;  Laterality: Right;  . lung biospy    . WRIST SURGERY      Current Medications: Current Meds  Medication Sig  . flecainide (TAMBOCOR) 50 MG tablet Take 50 mg by mouth 2 (two) times daily.  . metoprolol tartrate (LOPRESSOR) 25 MG tablet Take 1 tablet (25 mg total) by mouth 2 (two) times daily.     Allergies:   Codeine   Social History   Socioeconomic History  . Marital status: Single    Spouse name: Not on file  . Number of children: Not on file  . Years of education: Not on file  . Highest education level: Not on file  Occupational History  . Not on file  Social Needs  . Financial resource strain: Not on file  . Food insecurity    Worry: Not on file    Inability: Not on file  . Transportation needs    Medical: Not on file    Non-medical: Not on file  Tobacco Use  . Smoking status: Never Smoker  . Smokeless tobacco: Never Used  Substance and Sexual Activity  . Alcohol use: No    Frequency: Never  . Drug use: No  . Sexual activity: Not on file  Lifestyle  . Physical activity    Days per week: Not on file  Minutes per session: Not on file  . Stress: Not on file  Relationships  . Social Herbalist on phone: Not on file    Gets together: Not on file    Attends religious service: Not on file    Active member of club or organization: Not on file    Attends meetings of clubs or organizations: Not on file    Relationship status: Not on file  Other Topics Concern  . Not on file  Social History Narrative  . Not on file     Family History: The patient's family history includes Heart disease in his father and mother. ROS:   Please see the history of present illness.    All other systems reviewed and are negative.  EKGs/Labs/Other Studies Reviewed:    The following studies were reviewed  today:  EKG:  EKG ordered today and personally reviewed.  The ekg ordered today demonstrates sinus rhythm normal QRS is not prolonged from baseline  Recent Labs: 05/30/2018: Hemoglobin 15.3; Platelets 226 10/11/2018: BUN 12; Creatinine, Ser 1.24; Potassium 4.8; Sodium 142  Recent Lipid Panel    Component Value Date/Time   CHOL 181 03/19/2019 1456   TRIG 145 03/19/2019 1456   HDL 39 (L) 03/19/2019 1456   CHOLHDL 4.6 03/19/2019 1456   LDLCALC 113 (H) 03/19/2019 1456    Physical Exam:    VS:  BP 112/84 (BP Location: Left Arm, Patient Position: Sitting, Cuff Size: Large)   Pulse 70   Temp (!) 97.5 F (36.4 C)   Ht 6\' 2"  (1.88 m)   Wt 294 lb 6.4 oz (133.5 kg)   SpO2 97%   BMI 37.80 kg/m     Wt Readings from Last 3 Encounters:  05/24/19 294 lb 6.4 oz (133.5 kg)  03/05/19 (!) 315 lb (142.9 kg)  10/11/18 (!) 313 lb 12.8 oz (142.3 kg)     GEN:  Well nourished, well developed in no acute distress HEENT: Normal NECK: No JVD; No carotid bruits LYMPHATICS: No lymphadenopathy CARDIAC:  RRR, no murmurs, rubs, gallops RESPIRATORY:  Clear to auscultation without rales, wheezing or rhonchi  ABDOMEN: Soft, non-tender, non-distended MUSCULOSKELETAL:  No edema; No deformity  SKIN: Warm and dry NEUROLOGIC:  Alert and oriented x 3 PSYCHIATRIC:  Normal affect    Signed, Shirlee More, MD  05/24/2019 10:30 AM    [place]

## 2019-05-24 ENCOUNTER — Other Ambulatory Visit: Payer: Self-pay

## 2019-05-24 ENCOUNTER — Ambulatory Visit (INDEPENDENT_AMBULATORY_CARE_PROVIDER_SITE_OTHER): Payer: BC Managed Care – PPO | Admitting: Cardiology

## 2019-05-24 ENCOUNTER — Encounter: Payer: Self-pay | Admitting: Cardiology

## 2019-05-24 VITALS — BP 112/84 | HR 70 | Temp 97.5°F | Ht 74.0 in | Wt 294.4 lb

## 2019-05-24 DIAGNOSIS — I493 Ventricular premature depolarization: Secondary | ICD-10-CM | POA: Diagnosis not present

## 2019-05-24 DIAGNOSIS — I471 Supraventricular tachycardia: Secondary | ICD-10-CM

## 2019-05-24 NOTE — Patient Instructions (Signed)
Medication Instructions:  Your physician recommends that you continue on your current medications as directed. Please refer to the Current Medication list given to you today.  If you need a refill on your cardiac medications before your next appointment, please call your pharmacy.   Lab work: None  If you have labs (blood work) drawn today and your tests are completely normal, you will receive your results only by: Marland Kitchen MyChart Message (if you have MyChart) OR . A paper copy in the mail If you have any lab test that is abnormal or we need to change your treatment, we will call you to review the results.  Testing/Procedures: You had an EKG today.   Follow-Up: At [facility], you and your health needs are our priority.  As part of our continuing mission to provide you with exceptional heart care, we have created designated Provider Care Teams.  These Care Teams include your primary Cardiologist (physician) and Advanced Practice Providers (APPs -  Physician Assistants and Nurse Practitioners) who all work together to provide you with the care you need, when you need it. You will need a follow up appointment in 1 years.  Please call our office 2 months in advance to schedule this appointment.     **Send Kardia strips through your MyChart account. Attach the images to a patient message.

## 2019-07-04 ENCOUNTER — Other Ambulatory Visit: Payer: Self-pay

## 2019-07-04 DIAGNOSIS — I471 Supraventricular tachycardia: Secondary | ICD-10-CM

## 2019-07-04 MED ORDER — METOPROLOL TARTRATE 25 MG PO TABS: 25.0000 mg | ORAL_TABLET | Freq: Two times a day (BID) | ORAL | 0 refills | Status: DC

## 2019-08-28 ENCOUNTER — Other Ambulatory Visit: Payer: Self-pay | Admitting: Cardiology

## 2019-09-26 ENCOUNTER — Other Ambulatory Visit: Payer: Self-pay

## 2019-09-26 ENCOUNTER — Encounter: Payer: Self-pay | Admitting: Gastroenterology

## 2019-09-26 DIAGNOSIS — I471 Supraventricular tachycardia: Secondary | ICD-10-CM

## 2019-09-27 MED ORDER — METOPROLOL TARTRATE 25 MG PO TABS: 25.0000 mg | ORAL_TABLET | Freq: Two times a day (BID) | ORAL | 0 refills | Status: DC

## 2019-10-10 ENCOUNTER — Telehealth: Payer: Self-pay

## 2019-10-10 NOTE — Telephone Encounter (Signed)
Called patient and left voicemail to call back. Patient needs to be switched to virtual ov unless he is having rectal bleeding

## 2019-10-15 ENCOUNTER — Ambulatory Visit: Payer: BC Managed Care – PPO | Admitting: Gastroenterology

## 2019-10-15 ENCOUNTER — Other Ambulatory Visit: Payer: Self-pay

## 2019-10-15 ENCOUNTER — Encounter: Payer: Self-pay | Admitting: Gastroenterology

## 2019-10-15 VITALS — BP 124/78 | HR 69 | Temp 98.5°F | Ht 74.0 in | Wt 281.1 lb

## 2019-10-15 DIAGNOSIS — Z1212 Encounter for screening for malignant neoplasm of rectum: Secondary | ICD-10-CM

## 2019-10-15 DIAGNOSIS — R197 Diarrhea, unspecified: Secondary | ICD-10-CM | POA: Diagnosis not present

## 2019-10-15 DIAGNOSIS — Z1211 Encounter for screening for malignant neoplasm of colon: Secondary | ICD-10-CM

## 2019-10-15 DIAGNOSIS — R0789 Other chest pain: Secondary | ICD-10-CM

## 2019-10-15 MED ORDER — PANTOPRAZOLE SODIUM 20 MG PO TBEC: 20.0000 mg | DELAYED_RELEASE_TABLET | Freq: Every day | ORAL | 11 refills | Status: DC

## 2019-10-15 NOTE — Patient Instructions (Signed)
If you are age 55 or older, your body mass index should be between 23-30. Your Body mass index is 36.09 kg/m. If this is out of the aforementioned range listed, please consider follow up with your Primary Care Provider.  If you are age 25 or younger, your body mass index should be between 19-25. Your Body mass index is 36.09 kg/m. If this is out of the aformentioned range listed, please consider follow up with your Primary Care Provider.   We have sent the following medications to your pharmacy for you to pick up at your convenience: Protonix 20 mg once daily.   Please make an appointment with Dr. Bettina Gavia and call our office at [phone] to schedule your EGD/Colonoscopy after your appointment.   Thank you,  Dr. Jackquline Denmark

## 2019-10-15 NOTE — Progress Notes (Signed)
Chief Complaint: Atypical chest pain  Referring Provider:  No ref. provider found      ASSESSMENT AND PLAN;   #1. GERD with H/O small hiatal hernia.  Patient with atypical chest pain (ED visit 09/27/2019)  #2. Diarrhea likely due to Mg supplements.  #3. Colorectal cancer screening  Plan: - Protonix 20mg  po qd #30, 11 refills. - He will make FU with Dr Bettina Gavia - Decrease Mg 1/2 po qd - EGD/colon after cardio clearence.  I discussed risks and benefits.  Risks including small but definite risks of bleeding, perforation, missing colorectal neoplasms.  Benefits were also discussed.  He wishes to proceed.   HPI:    Alexander Travis is a 53 y.o. male  Seen in ED on 09/27/2019 with atypical chest pains.  MI was ruled out.  EKG was negative for acute MI. He has been having occasional heartburn and regurgitation.  He was given Protonix 20 mg p.o. once a day for 2 weeks which he finished recently.  He has done well since.  He was nevertheless advised to have a cardiology follow-up appointment.  He is being followed by Dr. Bettina Gavia for PSVT.  Last seen 05/2019.  Denies having any odynophagia or dysphagia.  Denies having any significant epigastric pain.  Had seen Dr. Amalia Hailey in the past as he had gallstones on CT at [facility].  He wanted to hold off on cholecystectomy.  No jaundice dark urine or pale stools.  No nausea or vomiting.  Has been having diarrhea over last few weeks-intermittent.  Used to have more constipation.  He does take magnesium supplements every day.  Did not have low magnesium in the past.  Denies having any significant abdominal pain.  No melena or hematochezia.  Has been trying hard to lose weight.  He has been able to lose from 317 to 276lb since May 2020 on [place].  He stopped drinking all sodas.  Past Medical History:  Diagnosis Date  . History of kidney cancer    dx 09/1998  . Palpitation 11/19/2015   Overview:  Seen in ED 11/11/15 without arrhythmia, CBC and CMP were  normal  . PSVT (paroxysmal supraventricular tachycardia) ([place])     Past Surgical History:  Procedure Laterality Date  . ESOPHAGOGASTRODUODENOSCOPY  10/23/2003   Minimal transiet hiatal hernia. Otherwise normal esophagogastroduodenoscopy   . KIDNEY SURGERY    . KNEE ARTHROSCOPY WITH MEDIAL MENISECTOMY Right 05/30/2018   Procedure: RIGHT KNEE ARTHROSCOPY WITH MEDIAL MENISECTOMY;  Surgeon: Garald Balding, MD;  Location: [facility];  Service: Orthopedics;  Laterality: Right;  . lung biospy    . WRIST SURGERY      Family History  Problem Relation Age of Onset  . Heart disease Mother   . Heart disease Father   . Colon cancer Neg Hx   . Esophageal cancer Neg Hx     Social History   Tobacco Use  . Smoking status: Never Smoker  . Smokeless tobacco: Never Used  Substance Use Topics  . Alcohol use: No    Frequency: Never  . Drug use: No    Current Outpatient Medications  Medication Sig Dispense Refill  . flecainide (TAMBOCOR) 50 MG tablet TAKE 2 TABLETS (100 MG) IN THE MORNING AND 1 TABLET (50 MG) IN THE EVENING. (Patient taking differently: Take 50 mg by mouth 2 (two) times daily. ) 270 tablet 2  . Magnesium 500 MG TABS Take by mouth.    . metoprolol tartrate (LOPRESSOR) 25 MG tablet Take  1 tablet (25 mg total) by mouth 2 (two) times daily. 180 tablet 0   No current facility-administered medications for this visit.     Allergies  Allergen Reactions  . Codeine Other (See Comments)    Hematuria    Review of Systems:  Constitutional: Denies fever, chills, diaphoresis, appetite change and fatigue.  HEENT: Denies photophobia, eye pain, redness, hearing loss, ear pain, congestion, sore throat, rhinorrhea, sneezing, mouth sores, neck pain, neck stiffness and tinnitus.   Respiratory: Denies SOB, DOE, cough, chest tightness,  and wheezing.   Cardiovascular: Denies chest pain, palpitations and leg swelling.  Genitourinary: Denies dysuria, urgency, frequency, hematuria, flank pain and  difficulty urinating.  Musculoskeletal: Denies myalgias, back pain, joint swelling, arthralgias and gait problem.  Skin: No rash.  Neurological: Denies dizziness, seizures, syncope, weakness, light-headedness, numbness and headaches.  Hematological: Denies adenopathy. Easy bruising, personal or family bleeding history  Psychiatric/Behavioral: No anxiety or depression     Physical Exam:    BP 124/78   Pulse 69   Temp 98.5 F (36.9 C)   Ht 6\' 2"  (1.88 m)   Wt 281 lb 2 oz (127.5 kg)   BMI 36.09 kg/m  Filed Weights   10/15/19 1100  Weight: 281 lb 2 oz (127.5 kg)   Constitutional:  Well-developed, in no acute distress. Psychiatric: Normal mood and affect. Behavior is normal. HEENT: Pupils normal.  Conjunctivae are normal. No scleral icterus. Neck supple.  Cardiovascular: Normal rate, regular rhythm. No edema Pulmonary/chest: Effort normal and breath sounds normal. No wheezing, rales or rhonchi. Abdominal: Soft, nondistended. Nontender. Bowel sounds active throughout. There are no masses palpable. No hepatomegaly. Rectal:  defered Neurological: Alert and oriented to person place and time. Skin: Skin is warm and dry. No rashes noted.  Data Reviewed: I have personally reviewed following labs and imaging studies  CBC: CBC Latest Ref Rng & Units 05/30/2018  WBC 4.0 - 10.5 K/uL 7.9  Hemoglobin 13.0 - 17.0 g/dL 15.3  Hematocrit 39.0 - 52.0 % 47.5  Platelets 150 - 400 K/uL 226    CMP: CMP Latest Ref Rng & Units 10/11/2018 05/30/2018  Glucose 65 - 99 mg/dL 83 121(H)  BUN 6 - 24 mg/dL 12 12  Creatinine 0.76 - 1.27 mg/dL 1.24 1.37(H)  Sodium 134 - 144 mmol/L 142 140  Potassium 3.5 - 5.2 mmol/L 4.8 4.0  Chloride 96 - 106 mmol/L 98 104  CO2 20 - 29 mmol/L 23 25  Calcium 8.7 - 10.2 mg/dL 10.3(H) 9.4   Labs from the ED visit on 09/27/2019: CBC with WBC count 9.5, hemoglobin 16.1, MCV 88, platelets 213.  Normal BMP. Chest x-ray-small left pleural effusion, mild atelectasis. EKG:  Sinus rhythm 73/min, left axis deviation, possible LVH. Extensive records were reviewed   Carmell Austria, MD 10/15/2019, 11:12 AM  Cc: Dr. Shirlee More.

## 2019-10-16 NOTE — Progress Notes (Addendum)
Cardiology Office Note:    Date:  10/17/2019   ID:  Alexander Travis, DOB 1966-10-16, MRN [id number]  PCP:  Patient, No Pcp Per  Cardiologist:  Shirlee More, MD    Referring MD: No ref. provider found   Narrative & Impression     Nuclear stress EF: 69%.  There was no ST segment deviation noted during stress.  This is a low risk study.  The left ventricular ejection fraction is hyperdynamic (>65%).  No evidence of ischemia or scar.  Normal EF.  His MPI is normal, no ischemia , proceed with endoscopy   ASSESSMENT:    1. Preop cardiovascular exam   2. PSVT (paroxysmal supraventricular tachycardia) ([place])   3. PVC (premature ventricular contraction)    PLAN:    In order of problems listed above:  1. The planned procedure is low risk however the patient has had a recent ED evaluation for acute coronary syndrome he is left unsure of himself concerned of heart disease and we decided to do a myocardial perfusion study prior to elective endoscopy will be performed the next week and also an addendum to this note to Dr. Lyndel Safe. 2. SVC and PVCs are stable continue flecainide beta-blocker.   Next appointment: 64-month   Medication Adjustments/Labs and Tests Ordered: Current medicines are reviewed at length with the patient today.  Concerns regarding medicines are outlined above.  No orders of the defined types were placed in this encounter.  No orders of the defined types were placed in this encounter.   No chief complaint on file.   History of Present Illness:    Alexander Travis is a 53 y.o. male with a hx of paroxysmal SVT who is been treated with flecainide last seen 05/24/2019. Compliance with diet, lifestyle and medications: Yes  Using the emergency room [facility] 09/27/2019.  He tells me the reason that he went to the hospital was pain in various locations most of it was central abdominal associated with constipation fairly severe lasting for hours at a  time it was not exertional and was not relieved with rest and was not anginal in nature.  He subsequently had pain in left shoulder left arm and in his back but he really tells me at no point time did he have chest pain.  He was evaluated for acute coronary syndrome and had no findings.  He has a background history of gallbladder disease and one time was pending cholecystectomy with Dr. Delfino Lovett happens.  He was treated for reflux and has had no recurrence.  See to my office today rarely does he have palpitation last night he had some single PVCs on his iPhone monitor.  He has had no recurrence of SVT and tolerates his flecainide.  He has no exertional chest pain dyspnea exercise intolerance palpitation or syncope he has had some discomfort in the left neck.  We discussed whether or not he should have an ischemia evaluation he is left unsettled by this ED visit and we decided to do it with myocardial perfusion study in my office prior to elective endoscopies although I think that the pretest probability will be low here. Past Medical History:  Diagnosis Date  . History of kidney cancer    dx 09/1998  . Palpitation 11/19/2015   Overview:  Seen in ED 11/11/15 without arrhythmia, CBC and CMP were normal  . PSVT (paroxysmal supraventricular tachycardia) ([facility])     Past Surgical History:  Procedure Laterality Date  . ESOPHAGOGASTRODUODENOSCOPY  10/23/2003   Minimal transiet hiatal hernia. Otherwise normal esophagogastroduodenoscopy   . KIDNEY SURGERY    . KNEE ARTHROSCOPY WITH MEDIAL MENISECTOMY Right 05/30/2018   Procedure: RIGHT KNEE ARTHROSCOPY WITH MEDIAL MENISECTOMY;  Surgeon: Garald Balding, MD;  Location: [facility];  Service: Orthopedics;  Laterality: Right;  . lung biospy Left    x2  . WRIST SURGERY Right     Current Medications: Current Meds  Medication Sig  . flecainide (TAMBOCOR) 50 MG tablet Take 50 mg by mouth 2 (two) times daily.  . Magnesium 500 MG TABS Take 0.5 tablets by mouth daily.    . metoprolol tartrate (LOPRESSOR) 25 MG tablet Take 1 tablet (25 mg total) by mouth 2 (two) times daily.  . pantoprazole (PROTONIX) 20 MG tablet Take 1 tablet (20 mg total) by mouth daily.     Allergies:   Codeine   Social History   Socioeconomic History  . Marital status: Single    Spouse name: Not on file  . Number of children: Not on file  . Years of education: Not on file  . Highest education level: Not on file  Occupational History  . Not on file  Social Needs  . Financial resource strain: Not on file  . Food insecurity    Worry: Not on file    Inability: Not on file  . Transportation needs    Medical: Not on file    Non-medical: Not on file  Tobacco Use  . Smoking status: Never Smoker  . Smokeless tobacco: Never Used  Substance and Sexual Activity  . Alcohol use: No    Frequency: Never  . Drug use: No  . Sexual activity: Not on file  Lifestyle  . Physical activity    Days per week: Not on file    Minutes per session: Not on file  . Stress: Not on file  Relationships  . Social Herbalist on phone: Not on file    Gets together: Not on file    Attends religious service: Not on file    Active member of club or organization: Not on file    Attends meetings of clubs or organizations: Not on file    Relationship status: Not on file  Other Topics Concern  . Not on file  Social History Narrative  . Not on file     Family History: The patient's family history includes Heart disease in his father and mother. There is no history of Colon cancer or Esophageal cancer. ROS:   Please see the history of present illness.    All other systems reviewed and are negative.  EKGs/Labs/Other Studies Reviewed:    The following studies were reviewed today:  EKG:  EKG ordered today and personally reviewed.  The ekg ordered today demonstrates sinus rhythm QRS duration 84 ms 1 PVC otherwise normal normal QT interval  Recent Labs: [place] ED BMP was  normal 2 troponins were low undetectable less than 3 without a rise between test.  I cannot see the EKG goes described as showing no ischemic changes he was advised to see cardiology at discharge from the ED No results found for requested labs within last 8760 hours.  Recent Lipid Panel    Component Value Date/Time   CHOL 181 03/19/2019 1456   TRIG 145 03/19/2019 1456   HDL 39 (L) 03/19/2019 1456   CHOLHDL 4.6 03/19/2019 1456   LDLCALC 113 (H) 03/19/2019 1456    Physical Exam:  VS:  BP 114/80 (BP Location: Right Arm, Patient Position: Sitting, Cuff Size: Large)   Pulse 63   Ht 6\' 2"  (1.88 m)   Wt 281 lb 12.8 oz (127.8 kg)   SpO2 99%   BMI 36.18 kg/m     Wt Readings from Last 3 Encounters:  10/17/19 281 lb 12.8 oz (127.8 kg)  10/15/19 281 lb 2 oz (127.5 kg)  05/24/19 294 lb 6.4 oz (133.5 kg)     GEN:  Well nourished, well developed in no acute distress HEENT: Normal NECK: No JVD; No carotid bruits LYMPHATICS: No lymphadenopathy CARDIAC: RRR, no murmurs, rubs, gallops RESPIRATORY:  Clear to auscultation without rales, wheezing or rhonchi  ABDOMEN: Soft, non-tender, non-distended MUSCULOSKELETAL:  No edema; No deformity  SKIN: Warm and dry NEUROLOGIC:  Alert and oriented x 3 PSYCHIATRIC:  Normal affect    Signed, Shirlee More, MD  10/17/2019 11:02 AM    [place]

## 2019-10-17 ENCOUNTER — Encounter: Payer: Self-pay | Admitting: Cardiology

## 2019-10-17 ENCOUNTER — Other Ambulatory Visit: Payer: Self-pay

## 2019-10-17 ENCOUNTER — Encounter: Payer: Self-pay | Admitting: *Deleted

## 2019-10-17 ENCOUNTER — Ambulatory Visit (INDEPENDENT_AMBULATORY_CARE_PROVIDER_SITE_OTHER): Payer: BC Managed Care – PPO | Admitting: Cardiology

## 2019-10-17 VITALS — BP 114/80 | HR 63 | Ht 74.0 in | Wt 281.8 lb

## 2019-10-17 DIAGNOSIS — I471 Supraventricular tachycardia: Secondary | ICD-10-CM

## 2019-10-17 DIAGNOSIS — Z01810 Encounter for preprocedural cardiovascular examination: Secondary | ICD-10-CM

## 2019-10-17 DIAGNOSIS — I493 Ventricular premature depolarization: Secondary | ICD-10-CM

## 2019-10-17 NOTE — Addendum Note (Signed)
Addended by: Austin Miles on: 10/17/2019 11:12 AM   Modules accepted: Orders

## 2019-10-17 NOTE — Patient Instructions (Signed)
Medication Instructions:  Your physician recommends that you continue on your current medications as directed. Please refer to the Current Medication list given to you today.  *If you need a refill on your cardiac medications before your next appointment, please call your pharmacy*  Lab Work: None  If you have labs (blood work) drawn today and your tests are completely normal, you will receive your results only by: Marland Kitchen MyChart Message (if you have MyChart) OR . A paper copy in the mail If you have any lab test that is abnormal or we need to change your treatment, we will call you to review the results.  Testing/Procedures: You had an EKG today.   Your physician has requested that you have a lexiscan myoview. For further information please visit [email or web address]. Please follow instruction sheet, as given.  Follow-Up: At [facility], you and your health needs are our priority.  As part of our continuing mission to provide you with exceptional heart care, we have created designated Provider Care Teams.  These Care Teams include your primary Cardiologist (physician) and Advanced Practice Providers (APPs -  Physician Assistants and Nurse Practitioners) who all work together to provide you with the care you need, when you need it.  Your next appointment:   6 week(s)  The format for your next appointment:   In Person  Provider:   Shirlee More, MD    Cardiac Nuclear Scan A cardiac nuclear scan is a test that is done to check the flow of blood to your heart. It is done when you are resting and when you are exercising. The test looks for problems such as:  Not enough blood reaching a portion of the heart.  The heart muscle not working as it should. You may need this test if:  You have heart disease.  You have had lab results that are not normal.  You have had heart surgery or a balloon procedure to open up blocked arteries (angioplasty).  You have chest pain.  You have  shortness of breath. In this test, a special dye (tracer) is put into your bloodstream. The tracer will travel to your heart. A camera will then take pictures of your heart to see how the tracer moves through your heart. This test is usually done at a hospital and takes 2-4 hours. Tell a doctor about:  Any allergies you have.  All medicines you are taking, including vitamins, herbs, eye drops, creams, and over-the-counter medicines.  Any problems you or family members have had with anesthetic medicines.  Any blood disorders you have.  Any surgeries you have had.  Any medical conditions you have.  Whether you are pregnant or may be pregnant. What are the risks? Generally, this is a safe test. However, problems may occur, such as:  Serious chest pain and heart attack. This is only a risk if the stress portion of the test is done.  Rapid heartbeat.  A feeling of warmth in your chest. This feeling usually does not last long.  Allergic reaction to the tracer. What happens before the test?  Ask your doctor about changing or stopping your normal medicines. This is important.  Follow instructions from your doctor about what you cannot eat or drink.  Remove your jewelry on the day of the test. What happens during the test?  An IV tube will be inserted into one of your veins.  Your doctor will give you a small amount of tracer through the IV tube.  You will wait  for 20-40 minutes while the tracer moves through your bloodstream.  Your heart will be monitored with an electrocardiogram (ECG).  You will lie down on an exam table.  Pictures of your heart will be taken for about 15-20 minutes.  You may also have a stress test. For this test, one of these things may be done: ? You will be asked to exercise on a treadmill or a stationary bike. ? You will be given medicines that will make your heart work harder. This is done if you are unable to exercise.  When blood flow to your  heart has peaked, a tracer will again be given through the IV tube.  After 20-40 minutes, you will get back on the exam table. More pictures will be taken of your heart.  Depending on the tracer that is used, more pictures may need to be taken 3-4 hours later.  Your IV tube will be removed when the test is over. The test may vary among doctors and hospitals. What happens after the test?  Ask your doctor: ? Whether you can return to your normal schedule, including diet, activities, and medicines. ? Whether you should drink more fluids. This will help to remove the tracer from your body. Drink enough fluid to keep your pee (urine) pale yellow.  Ask your doctor, or the department that is doing the test: ? When will my results be ready? ? How will I get my results? Summary  A cardiac nuclear scan is a test that is done to check the flow of blood to your heart.  Tell your doctor whether you are pregnant or may be pregnant.  Before the test, ask your doctor about changing or stopping your normal medicines. This is important.  Ask your doctor whether you can return to your normal activities. You may be asked to drink more fluids. This information is not intended to replace advice given to you by your health care provider. Make sure you discuss any questions you have with your health care provider. Document Released: 04/17/2018 Document Revised: 02/21/2019 Document Reviewed: 04/17/2018 Elsevier Patient Education  2020 [employer].

## 2019-11-14 ENCOUNTER — Encounter: Payer: Self-pay | Admitting: *Deleted

## 2019-11-14 ENCOUNTER — Telehealth: Payer: Self-pay | Admitting: *Deleted

## 2019-11-14 NOTE — Telephone Encounter (Signed)
Patient's mother per DPR was given detailed instructions per Myocardial Perfusion Study Information Sheet for the test on 11/20/2019 at 1130. Patient notified to arrive 15 minutes early and that it is imperative to arrive on time for appointment to keep from having the test rescheduled.  If you need to cancel or reschedule your appointment, please call the office within 24 hours of your appointment. . Patient verbalized understanding.Kenesha Moshier, Lucius Conn letter instructions sent

## 2019-11-20 ENCOUNTER — Other Ambulatory Visit: Payer: Self-pay

## 2019-11-20 ENCOUNTER — Ambulatory Visit (INDEPENDENT_AMBULATORY_CARE_PROVIDER_SITE_OTHER): Payer: BC Managed Care – PPO

## 2019-11-20 VITALS — Ht 74.0 in | Wt 281.0 lb

## 2019-11-20 DIAGNOSIS — I471 Supraventricular tachycardia, unspecified: Secondary | ICD-10-CM

## 2019-11-20 DIAGNOSIS — I493 Ventricular premature depolarization: Secondary | ICD-10-CM

## 2019-11-20 DIAGNOSIS — Z01810 Encounter for preprocedural cardiovascular examination: Secondary | ICD-10-CM | POA: Diagnosis not present

## 2019-11-20 DIAGNOSIS — R002 Palpitations: Secondary | ICD-10-CM

## 2019-11-20 MED ORDER — TECHNETIUM TC 99M TETROFOSMIN IV KIT
33.0000 | PACK | Freq: Once | INTRAVENOUS | Status: AC | PRN
  Administered 2019-11-20: 33 via INTRAVENOUS

## 2019-11-20 MED ORDER — REGADENOSON 0.4 MG/5ML IV SOLN
0.4000 mg | Freq: Once | INTRAVENOUS | Status: AC
  Administered 2019-11-20: 0.4 mg via INTRAVENOUS

## 2019-11-21 ENCOUNTER — Ambulatory Visit: Payer: BC Managed Care – PPO

## 2019-11-21 MED ORDER — TECHNETIUM TC 99M TETROFOSMIN IV KIT
31.3000 | PACK | Freq: Once | INTRAVENOUS | Status: AC | PRN
  Administered 2019-11-21: 31.3 via INTRAVENOUS

## 2019-11-22 LAB — MYOCARDIAL PERFUSION IMAGING
LV dias vol: 83 mL (ref 62–150)
LV sys vol: 26 mL
Peak HR: 104 {beats}/min
Rest HR: 79 {beats}/min
SDS: 3
SRS: 0
SSS: 3
TID: 0.81

## 2019-12-02 NOTE — Progress Notes (Signed)
Cleared by cardiology. Proceed with EGD/colonoscopy as outlined in the last note Thx RG

## 2019-12-03 ENCOUNTER — Telehealth: Payer: Self-pay

## 2019-12-03 DIAGNOSIS — Z1212 Encounter for screening for malignant neoplasm of rectum: Secondary | ICD-10-CM

## 2019-12-03 DIAGNOSIS — Z01818 Encounter for other preprocedural examination: Secondary | ICD-10-CM

## 2019-12-03 DIAGNOSIS — Z1211 Encounter for screening for malignant neoplasm of colon: Secondary | ICD-10-CM

## 2019-12-03 DIAGNOSIS — R197 Diarrhea, unspecified: Secondary | ICD-10-CM

## 2019-12-03 NOTE — Telephone Encounter (Signed)
Pt prefers a call tomorrow before 10:00am.

## 2019-12-03 NOTE — Telephone Encounter (Signed)
-----   Message from Jackquline Denmark, MD sent at 12/02/2019 10:14 AM EST -----   ----- Message ----- From: Richardo Priest, MD Sent: 11/22/2019   5:40 PM EST To: Jackquline Denmark, MD

## 2019-12-03 NOTE — Telephone Encounter (Signed)
Left message for patient to call back to the office;  

## 2019-12-04 ENCOUNTER — Other Ambulatory Visit: Payer: Self-pay

## 2019-12-04 ENCOUNTER — Encounter: Payer: Self-pay | Admitting: Cardiology

## 2019-12-04 ENCOUNTER — Ambulatory Visit (INDEPENDENT_AMBULATORY_CARE_PROVIDER_SITE_OTHER): Payer: BC Managed Care – PPO | Admitting: Cardiology

## 2019-12-04 ENCOUNTER — Ambulatory Visit (INDEPENDENT_AMBULATORY_CARE_PROVIDER_SITE_OTHER): Payer: BC Managed Care – PPO

## 2019-12-04 VITALS — BP 112/70 | HR 71 | Ht 74.0 in | Wt 285.0 lb

## 2019-12-04 DIAGNOSIS — I493 Ventricular premature depolarization: Secondary | ICD-10-CM

## 2019-12-04 DIAGNOSIS — Z01810 Encounter for preprocedural cardiovascular examination: Secondary | ICD-10-CM | POA: Diagnosis not present

## 2019-12-04 DIAGNOSIS — I471 Supraventricular tachycardia: Secondary | ICD-10-CM | POA: Diagnosis not present

## 2019-12-04 NOTE — Patient Instructions (Signed)
Medication Instructions:  Your physician recommends that you continue on your current medications as directed. Please refer to the Current Medication list given to you today.  *If you need a refill on your cardiac medications before your next appointment, please call your pharmacy*  Lab Work: None ordered  If you have labs (blood work) drawn today and your tests are completely normal, you will receive your results only by: Marland Kitchen MyChart Message (if you have MyChart) OR . A paper copy in the mail If you have any lab test that is abnormal or we need to change your treatment, we will call you to review the results.  Testing/Procedures: A zio monitor was ordered today. It will remain on for 14 days. You will then return monitor and event diary in provided box. It takes 1-2 weeks for report to be downloaded and returned to [place]. We will call you with the results. If monitor falls off or has orange flashing light, please call Zio for further instructions.     Follow-Up: At [facility], you and your health needs are our priority.  As part of our continuing mission to provide you with exceptional heart care, we have created designated Provider Care Teams.  These Care Teams include your primary Cardiologist (physician) and Advanced Practice Providers (APPs -  Physician Assistants and Nurse Practitioners) who all work together to provide you with the care you need, when you need it.  Your next appointment:   6 week(s)  The format for your next appointment:   In Person  Provider:   Shirlee More, MD  Other Instructions None

## 2019-12-04 NOTE — Telephone Encounter (Signed)
Left message for patient to call back to the office;  

## 2019-12-04 NOTE — Progress Notes (Signed)
Cardiology Office Note:    Date:  12/04/2019   ID:  Alexander Travis, DOB 02-14-1966, MRN [id number]  PCP:  Patient, No Pcp Per  Cardiologist:  Shirlee More, MD    Referring MD: No ref. provider found    ASSESSMENT:    1. PSVT (paroxysmal supraventricular tachycardia) ([place])   2. PVC (premature ventricular contraction)   3. Preop cardiovascular exam    PLAN:    In order of problems listed above:  1. He has had no recurrence of SVT but is having symptomatic palpitation PVCs further evaluation 2-week ZIO monitor for now continue low-dose flecainide beta-blocker. 2. Stable normal myocardial perfusion study he will proceed on with planned upper and lower endoscopy   Next appointment: 6 months   Medication Adjustments/Labs and Tests Ordered: Current medicines are reviewed at length with the patient today.  Concerns regarding medicines are outlined above.  No orders of the defined types were placed in this encounter.  No orders of the defined types were placed in this encounter.   No chief complaint on file.   History of Present Illness:   Alexander Travis is a 54 y.o. male with a hx of 10/17/2019 history of SVT last seen.  His myocardial perfusion test was normal.  The day of the test he was told he was having PVCs and has become very focused on it said aware of his heart beating especially since then in the evening concerned and will utilize an CO monitor to reassess his arrhythmia he is having no breakthrough episodes of tachycardia and tolerates flecainide and metoprolol.  Reviewed the EKG 11/20/2019 sinus rhythm normal with occasional PVCs Compliance with diet, lifestyle and medications: Yes  Study Highlights   Nuclear stress EF: 69%.  There was no ST segment deviation noted during stress.  This is a low risk study.  The left ventricular ejection fraction is hyperdynamic (>65%).  No evidence of ischemia or scar.  Normal EF.    Past Medical History:  Diagnosis  Date  . History of kidney cancer    dx 09/1998  . Palpitation 11/19/2015   Overview:  Seen in ED 11/11/15 without arrhythmia, CBC and CMP were normal  . PSVT (paroxysmal supraventricular tachycardia) ([place])     Past Surgical History:  Procedure Laterality Date  . ESOPHAGOGASTRODUODENOSCOPY  10/23/2003   Minimal transiet hiatal hernia. Otherwise normal esophagogastroduodenoscopy   . KIDNEY SURGERY    . KNEE ARTHROSCOPY WITH MEDIAL MENISECTOMY Right 05/30/2018   Procedure: RIGHT KNEE ARTHROSCOPY WITH MEDIAL MENISECTOMY;  Surgeon: Garald Balding, MD;  Location: [place];  Service: Orthopedics;  Laterality: Right;  . lung biospy Left    x2  . WRIST SURGERY Right     Current Medications: Current Meds  Medication Sig  . flecainide (TAMBOCOR) 50 MG tablet Take 50 mg by mouth 2 (two) times daily.  . Magnesium 500 MG TABS Take 0.5 tablets by mouth daily.   . metoprolol tartrate (LOPRESSOR) 25 MG tablet Take 1 tablet (25 mg total) by mouth 2 (two) times daily.  . pantoprazole (PROTONIX) 20 MG tablet Take 1 tablet (20 mg total) by mouth daily.     Allergies:   Codeine   Social History   Socioeconomic History  . Marital status: Single    Spouse name: Not on file  . Number of children: Not on file  . Years of education: Not on file  . Highest education level: Not on file  Occupational History  . Not on file  Tobacco Use  . Smoking status: Never Smoker  . Smokeless tobacco: Never Used  Substance and Sexual Activity  . Alcohol use: No  . Drug use: No  . Sexual activity: Not on file  Other Topics Concern  . Not on file  Social History Narrative  . Not on file   Social Determinants of Health   Financial Resource Strain:   . Difficulty of Paying Living Expenses: Not on file  Food Insecurity:   . Worried About Charity fundraiser in the Last Year: Not on file  . Ran Out of Food in the Last Year: Not on file  Transportation Needs:   . Lack of Transportation (Medical): Not on  file  . Lack of Transportation (Non-Medical): Not on file  Physical Activity:   . Days of Exercise per Week: Not on file  . Minutes of Exercise per Session: Not on file  Stress:   . Feeling of Stress : Not on file  Social Connections:   . Frequency of Communication with Friends and Family: Not on file  . Frequency of Social Gatherings with Friends and Family: Not on file  . Attends Religious Services: Not on file  . Active Member of Clubs or Organizations: Not on file  . Attends Archivist Meetings: Not on file  . Marital Status: Not on file     Family History: The patient's family history includes Heart disease in his father and mother. There is no history of Colon cancer or Esophageal cancer. ROS:   Please see the history of present illness.    All other systems reviewed and are negative.  EKGs/Labs/Other Studies Reviewed:    The following studies were reviewed today:    Recent Labs: No results found for requested labs within last 8760 hours.  Recent Lipid Panel    Component Value Date/Time   CHOL 181 03/19/2019 1456   TRIG 145 03/19/2019 1456   HDL 39 (L) 03/19/2019 1456   CHOLHDL 4.6 03/19/2019 1456   LDLCALC 113 (H) 03/19/2019 1456    Physical Exam:    VS:  BP 112/70   Pulse 71   Ht 6\' 2"  (1.88 m)   Wt 285 lb (129.3 kg)   SpO2 95%   BMI 36.59 kg/m     Wt Readings from Last 3 Encounters:  12/04/19 285 lb (129.3 kg)  11/20/19 281 lb (127.5 kg)  10/17/19 281 lb 12.8 oz (127.8 kg)     GEN:  Well nourished, well developed in no acute distress HEENT: Normal NECK: No JVD; No carotid bruits LYMPHATICS: No lymphadenopathy CARDIAC: RRR, no murmurs, rubs, gallops RESPIRATORY:  Clear to auscultation without rales, wheezing or rhonchi  ABDOMEN: Soft, non-tender, non-distended MUSCULOSKELETAL:  No edema; No deformity  SKIN: Warm and dry NEUROLOGIC:  Alert and oriented x 3 PSYCHIATRIC:  Normal affect    Signed, Shirlee More, MD  12/04/2019 2:10 PM     [facility]

## 2019-12-05 ENCOUNTER — Encounter: Payer: Self-pay | Admitting: Gastroenterology

## 2019-12-05 NOTE — Telephone Encounter (Addendum)
Called and spoke with patient-patient has been scheduled for a COVID screening on 12/12/2019 at 9:45 am; patient has also been scheduled for his EGD/colon with Dr. Lyndel Safe at [facility] on 12/14/2019 at 3:30 pm; instructions have been sent to the patient via [place] per his request-also have been mailed to the patient; Patient advised to call back to the office at [phone] should questions/concerns arise;  Patient verbalized understanding of information/instructions;

## 2019-12-05 NOTE — Addendum Note (Signed)
Addended by: Mohammed Kindle on: 12/05/2019 10:16 AM   Modules accepted: Orders

## 2019-12-10 ENCOUNTER — Telehealth: Payer: Self-pay | Admitting: Gastroenterology

## 2019-12-10 MED ORDER — SUPREP BOWEL PREP KIT 17.5-3.13-1.6 GM/177ML PO SOLN: 1.0000 | ORAL | 0 refills | Status: DC

## 2019-12-10 NOTE — Telephone Encounter (Signed)
I have sent prescription to patients pharmacy, patient notified.  

## 2019-12-10 NOTE — Telephone Encounter (Signed)
Patient called to inform us that [facility] at [facility] does not have his prep. Procedure scheduled for 12/14/19.Marland Kitchen

## 2019-12-11 ENCOUNTER — Telehealth: Payer: Self-pay | Admitting: Gastroenterology

## 2019-12-11 NOTE — Telephone Encounter (Signed)
Offered patient to come by [facility] office to pick up sample.

## 2019-12-12 ENCOUNTER — Other Ambulatory Visit: Payer: Self-pay | Admitting: Gastroenterology

## 2019-12-12 ENCOUNTER — Telehealth: Payer: Self-pay | Admitting: Gastroenterology

## 2019-12-12 ENCOUNTER — Ambulatory Visit (INDEPENDENT_AMBULATORY_CARE_PROVIDER_SITE_OTHER): Payer: BC Managed Care – PPO

## 2019-12-12 DIAGNOSIS — Z1159 Encounter for screening for other viral diseases: Secondary | ICD-10-CM

## 2019-12-12 LAB — SARS CORONAVIRUS 2 (TAT 6-24 HRS): SARS Coronavirus 2: POSITIVE — AB

## 2019-12-12 NOTE — Telephone Encounter (Signed)
Alexander Travis received his results thru My Chart. His procedure has been cancelled.

## 2019-12-12 NOTE — Progress Notes (Deleted)
[facility] called to let us know that this patient tested positive for COVID. The office was notified and they are calling the patient.

## 2019-12-12 NOTE — Progress Notes (Signed)
This patient was found to be positive for COVID-19 on routine pre-procedure testing for [place].  This test result was explained to him over the phone.  They are aware that their upcoming [place] endoscopic procedure will be cancelled.  They were advised to isolate themselves for at least 10 days from the date of this positive test.  They were instructed to contact us if any problems. He doesnot have a PCP. Encouraged him to get one.  RG

## 2019-12-13 NOTE — Telephone Encounter (Signed)
I think his procedures have already been canceled for now Please make sure Thx  RG

## 2019-12-14 ENCOUNTER — Encounter: Payer: BC Managed Care – PPO | Admitting: Gastroenterology

## 2019-12-20 ENCOUNTER — Other Ambulatory Visit: Payer: Self-pay | Admitting: Cardiology

## 2019-12-20 DIAGNOSIS — I471 Supraventricular tachycardia: Secondary | ICD-10-CM

## 2020-01-11 ENCOUNTER — Ambulatory Visit (INDEPENDENT_AMBULATORY_CARE_PROVIDER_SITE_OTHER): Payer: BC Managed Care – PPO | Admitting: Cardiology

## 2020-01-11 ENCOUNTER — Encounter: Payer: Self-pay | Admitting: Cardiology

## 2020-01-11 ENCOUNTER — Other Ambulatory Visit: Payer: Self-pay

## 2020-01-11 VITALS — BP 114/62 | HR 61 | Temp 96.6°F | Ht 74.0 in | Wt 287.0 lb

## 2020-01-11 DIAGNOSIS — I493 Ventricular premature depolarization: Secondary | ICD-10-CM | POA: Diagnosis not present

## 2020-01-11 DIAGNOSIS — I471 Supraventricular tachycardia: Secondary | ICD-10-CM

## 2020-01-11 NOTE — Patient Instructions (Signed)
Medication Instructions:  Your physician recommends that you continue on your current medications as directed. Please refer to the Current Medication list given to you today.  *If you need a refill on your cardiac medications before your next appointment, please call your pharmacy*   Lab Work: Your physician recommends that you return for lab work in: [place], FLECAINIDE LEVEL   If you have labs (blood work) drawn today and your tests are completely normal, you will receive your results only by: Marland Kitchen MyChart Message (if you have MyChart) OR . A paper copy in the mail If you have any lab test that is abnormal or we need to change your treatment, we will call you to review the results.   Testing/Procedures: None   Follow-Up: At [facility], you and your health needs are our priority.  As part of our continuing mission to provide you with exceptional heart care, we have created designated Provider Care Teams.  These Care Teams include your primary Cardiologist (physician) and Advanced Practice Providers (APPs -  Physician Assistants and Nurse Practitioners) who all work together to provide you with the care you need, when you need it.  We recommend signing up for the patient portal called "MyChart".  Sign up information is provided on this After Visit Summary.  MyChart is used to connect with patients for Virtual Visits (Telemedicine).  Patients are able to view lab/test results, encounter notes, upcoming appointments, etc.  Non-urgent messages can be sent to your provider as well.   To learn more about what you can do with MyChart, go to [email or web address].    Your next appointment:   12 month(s)  The format for your next appointment:   In Person  Provider:   Shirlee More, MD   Other Instructions

## 2020-01-11 NOTE — Progress Notes (Signed)
Cardiology Office Note:    Date:  01/11/2020   ID:  Alexander Travis, DOB 1966-01-30, MRN [id number]  PCP:  Patient, No Pcp Per  Cardiologist:  Shirlee More, MD    Referring MD: No ref. provider found    ASSESSMENT:    1. PVC (premature ventricular contraction)   2. PSVT (paroxysmal supraventricular tachycardia) ([facility])    PLAN:    In order of problems listed above:  1. Improved for now continue his current treatment low-dose flecainide beta-blocker and if symptoms worsen will refer to EP for consideration of catheter ablation of PVCs or alternate antiarrhythmic drug.  Is close to year and will check labs including a complete panel flecainide level   Next appointment: 1 year   Medication Adjustments/Labs and Tests Ordered: Current medicines are reviewed at length with the patient today.  Concerns regarding medicines are outlined above.  Orders Placed This Encounter  Procedures  . EKG 12-Lead   No orders of the defined types were placed in this encounter.   Chief Complaint  Patient presents with  . Follow-up    History of Present Illness:    Alexander Travis is a 54 y.o. male with a hx of PSVT and PVC's on flecanide last seen 12/04/2019. Compliance with diet, lifestyle and medications: Yes  His palpitation has regressed he is comfortable with how he feels on flecainide.  We reviewed the options of referral to EP for consideration of PVC ablation but I do not think the volume is high enough or another antiarrhythmic drug but at this time is doing well and will continue her current regimen.  Study Highlights  A ZIO monitor was performed for 14 days beginning 12/04/2019 to assess for ventricular arrhythmia. Rhythm throughout is sinus first-degree AV block with average minimum and maximum heart rates of 61, 47 and 92 bpm. There were no pauses of 3 seconds or greater and no episodes of second or third-degree AV block or sinus node exit block.  Ventricular ectopy was frequent with  a total burden of 5.9% couplets and triplets were rare less than 1%.  There is 1 4 beat run of ventricular premature contractions noted.  Another strip was most consistent with artifact lead attachment.  There was no supraventricular ectopy noted. There were 6 triggered and 2 diary events predominantly associated with frequent PVCs    Conclusion: Frequent PVCs with couplets triplets and 1 4 beat run of ventricular premature contractions.  The triggered and diary events are generally associated with frequent PVCs    Past Medical History:  Diagnosis Date  . History of kidney cancer    dx 09/1998  . Palpitation 11/19/2015   Overview:  Seen in ED 11/11/15 without arrhythmia, CBC and CMP were normal  . PSVT (paroxysmal supraventricular tachycardia) ([place])     Past Surgical History:  Procedure Laterality Date  . ESOPHAGOGASTRODUODENOSCOPY  10/23/2003   Minimal transiet hiatal hernia. Otherwise normal esophagogastroduodenoscopy   . KIDNEY SURGERY    . KNEE ARTHROSCOPY WITH MEDIAL MENISECTOMY Right 05/30/2018   Procedure: RIGHT KNEE ARTHROSCOPY WITH MEDIAL MENISECTOMY;  Surgeon: Garald Balding, MD;  Location: [place];  Service: Orthopedics;  Laterality: Right;  . lung biospy Left    x2  . WRIST SURGERY Right     Current Medications: Current Meds  Medication Sig  . flecainide (TAMBOCOR) 50 MG tablet Take 50 mg by mouth 2 (two) times daily.  . metoprolol tartrate (LOPRESSOR) 25 MG tablet TAKE 1 TABLET BY MOUTH TWICE A DAY  .  pantoprazole (PROTONIX) 20 MG tablet Take 1 tablet (20 mg total) by mouth daily.     Allergies:   Codeine   Social History   Socioeconomic History  . Marital status: Single    Spouse name: Not on file  . Number of children: Not on file  . Years of education: Not on file  . Highest education level: Not on file  Occupational History  . Not on file  Tobacco Use  . Smoking status: Never Smoker  . Smokeless tobacco: Never Used  Substance and Sexual Activity  .  Alcohol use: No  . Drug use: No  . Sexual activity: Not Currently  Other Topics Concern  . Not on file  Social History Narrative  . Not on file   Social Determinants of Health   Financial Resource Strain:   . Difficulty of Paying Living Expenses: Not on file  Food Insecurity:   . Worried About Charity fundraiser in the Last Year: Not on file  . Ran Out of Food in the Last Year: Not on file  Transportation Needs:   . Lack of Transportation (Medical): Not on file  . Lack of Transportation (Non-Medical): Not on file  Physical Activity:   . Days of Exercise per Week: Not on file  . Minutes of Exercise per Session: Not on file  Stress:   . Feeling of Stress : Not on file  Social Connections:   . Frequency of Communication with Friends and Family: Not on file  . Frequency of Social Gatherings with Friends and Family: Not on file  . Attends Religious Services: Not on file  . Active Member of Clubs or Organizations: Not on file  . Attends Archivist Meetings: Not on file  . Marital Status: Not on file     Family History: The patient's family history includes Heart disease in his father and mother. There is no history of Colon cancer or Esophageal cancer. ROS:   Please see the history of present illness.    All other systems reviewed and are negative.  EKGs/Labs/Other Studies Reviewed:    The following studies were reviewed today:  EKG:  EKG ordered today and personally reviewed.  The ekg ordered today demonstrates this rhythm left axis deviation left anterior hemiblock otherwise normal  Recent Labs: No results found for requested labs within last 8760 hours.  Recent Lipid Panel    Component Value Date/Time   CHOL 181 03/19/2019 1456   TRIG 145 03/19/2019 1456   HDL 39 (L) 03/19/2019 1456   CHOLHDL 4.6 03/19/2019 1456   LDLCALC 113 (H) 03/19/2019 1456    Physical Exam:    VS:  BP 114/62   Pulse 61   Temp (!) 96.6 F (35.9 C)   Ht 6\' 2"  (1.88 m)   Wt  287 lb (130.2 kg)   SpO2 98%   BMI 36.85 kg/m     Wt Readings from Last 3 Encounters:  01/11/20 287 lb (130.2 kg)  12/04/19 285 lb (129.3 kg)  11/20/19 281 lb (127.5 kg)     GEN:  Well nourished, well developed in no acute distress HEENT: Normal NECK: No JVD; No carotid bruits LYMPHATICS: No lymphadenopathy CARDIAC: RRR, no murmurs, rubs, gallops RESPIRATORY:  Clear to auscultation without rales, wheezing or rhonchi  ABDOMEN: Soft, non-tender, non-distended MUSCULOSKELETAL:  No edema; No deformity  SKIN: Warm and dry NEUROLOGIC:  Alert and oriented x 3 PSYCHIATRIC:  Normal affect    Signed, Shirlee More, MD  01/11/2020 2:40 PM    [facility]

## 2020-01-16 LAB — COMPREHENSIVE METABOLIC PANEL
ALT: 17 IU/L (ref 0–44)
AST: 20 IU/L (ref 0–40)
Albumin/Globulin Ratio: 1.3 (ref 1.2–2.2)
Albumin: 4.1 g/dL (ref 3.8–4.9)
Alkaline Phosphatase: 104 IU/L (ref 39–117)
BUN/Creatinine Ratio: 13 (ref 9–20)
BUN: 14 mg/dL (ref 6–24)
Bilirubin Total: 0.5 mg/dL (ref 0.0–1.2)
CO2: 24 mmol/L (ref 20–29)
Calcium: 9.4 mg/dL (ref 8.7–10.2)
Chloride: 103 mmol/L (ref 96–106)
Creatinine, Ser: 1.12 mg/dL (ref 0.76–1.27)
GFR calc Af Amer: 85 mL/min/{1.73_m2} (ref >59)
GFR calc non Af Amer: 74 mL/min/{1.73_m2} (ref >59)
Globulin, Total: 3.1 g/dL (ref 1.5–4.5)
Glucose: 85 mg/dL (ref 65–99)
Potassium: 4.5 mmol/L (ref 3.5–5.2)
Sodium: 142 mmol/L (ref 134–144)
Total Protein: 7.2 g/dL (ref 6.0–8.5)

## 2020-01-16 LAB — FLECAINIDE LEVEL: Flecainide: 0.18 ug/mL — ABNORMAL LOW (ref 0.20–1.00)

## 2020-01-17 ENCOUNTER — Telehealth: Payer: Self-pay

## 2020-01-17 NOTE — Telephone Encounter (Signed)
-----   Message from Richardo Priest, MD sent at 01/17/2020  1:45 PM EST ----- Normal or stable result  No changes

## 2020-01-17 NOTE — Telephone Encounter (Signed)
The patient has been notified of the lab result and verbalized understanding.  All questions (if any) were answered. Frederik Schmidt, RN 01/17/2020 3:06 PM

## 2020-01-17 NOTE — Telephone Encounter (Signed)
Lpm informing him of results. 3/4

## 2020-01-22 ENCOUNTER — Encounter: Payer: BC Managed Care – PPO | Admitting: Gastroenterology

## 2020-01-25 ENCOUNTER — Encounter: Payer: BC Managed Care – PPO | Admitting: Gastroenterology

## 2020-02-07 ENCOUNTER — Encounter: Payer: Self-pay | Admitting: Gastroenterology

## 2020-02-07 ENCOUNTER — Ambulatory Visit (AMBULATORY_SURGERY_CENTER): Payer: BC Managed Care – PPO | Admitting: Gastroenterology

## 2020-02-07 ENCOUNTER — Other Ambulatory Visit: Payer: Self-pay

## 2020-02-07 VITALS — BP 126/75 | HR 64 | Temp 96.9°F | Resp 14 | Ht 74.0 in | Wt 285.0 lb

## 2020-02-07 DIAGNOSIS — K449 Diaphragmatic hernia without obstruction or gangrene: Secondary | ICD-10-CM | POA: Diagnosis not present

## 2020-02-07 DIAGNOSIS — D128 Benign neoplasm of rectum: Secondary | ICD-10-CM

## 2020-02-07 DIAGNOSIS — K219 Gastro-esophageal reflux disease without esophagitis: Secondary | ICD-10-CM

## 2020-02-07 DIAGNOSIS — K2950 Unspecified chronic gastritis without bleeding: Secondary | ICD-10-CM

## 2020-02-07 DIAGNOSIS — Z1211 Encounter for screening for malignant neoplasm of colon: Secondary | ICD-10-CM

## 2020-02-07 DIAGNOSIS — K621 Rectal polyp: Secondary | ICD-10-CM | POA: Diagnosis not present

## 2020-02-07 DIAGNOSIS — R0789 Other chest pain: Secondary | ICD-10-CM

## 2020-02-07 DIAGNOSIS — D129 Benign neoplasm of anus and anal canal: Secondary | ICD-10-CM

## 2020-02-07 MED ORDER — SODIUM CHLORIDE 0.9 % IV SOLN: 500.0000 mL | Freq: Once | INTRAVENOUS | Status: DC

## 2020-02-07 NOTE — Progress Notes (Signed)
Called to room to assist during endoscopic procedure.  Patient ID and intended procedure confirmed with present staff. Received instructions for my participation in the procedure from the performing physician.  

## 2020-02-07 NOTE — Op Note (Signed)
[employer] [employer] Patient Name: Alexander Travis Procedure Date: 02/07/2020 10:27 AM MRN: [id number] Endoscopist: Jackquline Denmark , MD Age: 54 Referring MD:  Date of Birth: [birth year] Gender: Male Account #: [id number] Procedure:                Upper GI endoscopy Indications:              GERD with noncardiac chest pains. Medicines:                Monitored Anesthesia Care Procedure:                Pre-Anesthesia Assessment:                           - Prior to the procedure, a History and Physical                            was performed, and patient medications and                            allergies were reviewed. The patient's tolerance of                            previous anesthesia was also reviewed. The risks                            and benefits of the procedure and the sedation                            options and risks were discussed with the patient.                            All questions were answered, and informed consent                            was obtained. Prior Anticoagulants: The patient has                            taken no previous anticoagulant or antiplatelet                            agents. ASA Grade Assessment: II - A patient with                            mild systemic disease. After reviewing the risks                            and benefits, the patient was deemed in                            satisfactory condition to undergo the procedure.                           After obtaining informed consent, the endoscope was  passed under direct vision. Throughout the                            procedure, the patient's blood pressure, pulse, and                            oxygen saturations were monitored continuously. The                            Endoscope was introduced through the mouth, and                            advanced to the second part of duodenum. The upper                            GI endoscopy was  accomplished without difficulty.                            The patient tolerated the procedure well. Scope In: Scope Out: Findings:                 The examined esophagus was normal. Biopsies were                            taken with a cold forceps for histology. A small                            transient HH was noted on full distention of the                            lower end of the esophagus.                           Localized minimal inflammation characterized by                            erythema was found in the gastric antrum. Biopsies                            were taken with a cold forceps for histology.                           The examined duodenum was normal. Biopsies for                            histology were taken with a cold forceps for                            evaluation of celiac disease.                           A single 10 mm submucosal papule (nodule) with no  bleeding and no stigmata of recent bleeding was                            found in the gastric body. Biopsies were taken with                            a cold forceps for histology. Complications:            No immediate complications. Estimated Blood Loss:     Estimated blood loss: none. Impression:               -Minimal hiatal hernia                           -Mild gastritis.                           -A single submucosal papule (nodule) found in the                            stomach. Biopsied. Recommendation:           - Resume previous diet.                           - Continue present medications.                           - Await pathology results.                           - Return to GI clinic in 12 weeks. Jackquline Denmark, MD 02/07/2020 11:25:30 AM This report has been signed electronically.

## 2020-02-07 NOTE — Op Note (Signed)
[place] Patient Name: Alexander Travis Procedure Date: 02/07/2020 10:55 AM MRN: [id number] Endoscopist: Jackquline Denmark , MD Age: 54 Referring MD:  Date of Birth: Jul 29, 1966 Gender: Male Account #: [id number] Procedure:                Colonoscopy Indications:              Screening for colorectal malignant neoplasm Medicines:                Monitored Anesthesia Care Procedure:                Pre-Anesthesia Assessment:                           - Prior to the procedure, a History and Physical                            was performed, and patient medications and                            allergies were reviewed. The patient's tolerance of                            previous anesthesia was also reviewed. The risks                            and benefits of the procedure and the sedation                            options and risks were discussed with the patient.                            All questions were answered, and informed consent                            was obtained. Prior Anticoagulants: The patient has                            taken no previous anticoagulant or antiplatelet                            agents. ASA Grade Assessment: II - A patient with                            mild systemic disease. After reviewing the risks                            and benefits, the patient was deemed in                            satisfactory condition to undergo the procedure.                           After obtaining informed consent, the colonoscope  was passed under direct vision. Throughout the                            procedure, the patient's blood pressure, pulse, and                            oxygen saturations were monitored continuously. The                            Colonoscope was introduced through the anus and                            advanced to the 2 cm into the ileum. The                            colonoscopy was performed  without difficulty. The                            patient tolerated the procedure well. The quality                            of the bowel preparation was good. The terminal                            ileum, ileocecal valve, appendiceal orifice, and                            rectum were photographed. Scope In: 10:57:11 AM Scope Out: 11:07:30 AM Scope Withdrawal Time: 0 hours 8 minutes 8 seconds  Total Procedure Duration: 0 hours 10 minutes 19 seconds  Findings:                 A 4 mm polyp was found in the rectum. The polyp was                            sessile. The polyp was removed with a cold biopsy                            forceps. Resection and retrieval were complete.                            Estimated blood loss: none.                           A 8 mm polyp was found in the rectum. The polyp was                            sessile. The polyp was removed with a cold snare.                            Resection and retrieval were complete.                           The  colon (entire examined portion) appeared normal.                           Non-bleeding internal hemorrhoids were found during                            retroflexion. The hemorrhoids were moderate.                           The terminal ileum appeared normal.                           The exam was otherwise without abnormality on                            direct and retroflexion views. Complications:            No immediate complications. Estimated Blood Loss:     Estimated blood loss: none. Impression:               - One 4 mm polyp in the rectum, removed with a cold                            biopsy forceps. Resected and retrieved.                           - One 8 mm polyp in the rectum, removed with a cold                            snare. Resected and retrieved.                           - The entire examined colon is normal.                           - Non-bleeding internal hemorrhoids.                            - The examined portion of the ileum was normal.                           - The examination was otherwise normal on direct                            and retroflexion views. Recommendation:           - Patient has a contact number available for                            emergencies. The signs and symptoms of potential                            delayed complications were discussed with the                            patient. Return to normal activities  tomorrow.                            Written discharge instructions were provided to the                            patient.                           - Resume previous diet.                           - Continue present medications.                           - Await pathology results.                           - Repeat colonoscopy for surveillance based on                            pathology results.                           - Return to GI clinic PRN. Jackquline Denmark, MD 02/07/2020 11:19:51 AM This report has been signed electronically.

## 2020-02-07 NOTE — Patient Instructions (Signed)
Handouts on polyps, hemorrhoids, gastritis, and hiatal hernia given to you today  Await pathology results    YOU HAD AN ENDOSCOPIC PROCEDURE TODAY AT [facility]:   Refer to the procedure report that was given to you for any specific questions about what was found during the examination.  If the procedure report does not answer your questions, please call your gastroenterologist to clarify.  If you requested that your care partner not be given the details of your procedure findings, then the procedure report has been included in a sealed envelope for you to review at your convenience later.  YOU SHOULD EXPECT: Some feelings of bloating in the abdomen. Passage of more gas than usual.  Walking can help get rid of the air that was put into your GI tract during the procedure and reduce the bloating. If you had a lower endoscopy (such as a colonoscopy or flexible sigmoidoscopy) you may notice spotting of blood in your stool or on the toilet paper. If you underwent a bowel prep for your procedure, you may not have a normal bowel movement for a few days.  Please Note:  You might notice some irritation and congestion in your nose or some drainage.  This is from the oxygen used during your procedure.  There is no need for concern and it should clear up in a day or so.  SYMPTOMS TO REPORT IMMEDIATELY:   Following lower endoscopy (colonoscopy or flexible sigmoidoscopy):  Excessive amounts of blood in the stool  Significant tenderness or worsening of abdominal pains  Swelling of the abdomen that is new, acute  Fever of 100F or higher   Following upper endoscopy (EGD)  Vomiting of blood or coffee ground material  New chest pain or pain under the shoulder blades  Painful or persistently difficult swallowing  New shortness of breath  Fever of 100F or higher  Black, tarry-looking stools  For urgent or emergent issues, a gastroenterologist can be reached at any hour by calling [phone]  [phone]. Do not use MyChart messaging for urgent concerns.    DIET:  We do recommend a small meal at first, but then you may proceed to your regular diet.  Drink plenty of fluids but you should avoid alcoholic beverages for 24 hours.  ACTIVITY:  You should plan to take it easy for the rest of today and you should NOT DRIVE or use heavy machinery until tomorrow (because of the sedation medicines used during the test).    FOLLOW UP: Our staff will call the number listed on your records 48-72 hours following your procedure to check on you and address any questions or concerns that you may have regarding the information given to you following your procedure. If we do not reach you, we will leave a message.  We will attempt to reach you two times.  During this call, we will ask if you have developed any symptoms of COVID 19. If you develop any symptoms (ie: fever, flu-like symptoms, shortness of breath, cough etc.) before then, please call [phone].  If you test positive for Covid 19 in the 2 weeks post procedure, please call and report this information to [place].    If any biopsies were taken you will be contacted by phone or by letter within the next 1-3 weeks.  Please call us at [phone] if you have not heard about the biopsies in 3 weeks.    SIGNATURES/CONFIDENTIALITY: You and/or your care partner have signed paperwork which will be  entered into your electronic medical record.  These signatures attest to the fact that that the information above on your After Visit Summary has been reviewed and is understood.  Full responsibility of the confidentiality of this discharge information lies with you and/or your care-partner. 

## 2020-02-07 NOTE — Progress Notes (Signed)
To PACU, VSS. Report to [email or web address] 

## 2020-02-11 ENCOUNTER — Telehealth: Payer: Self-pay | Admitting: *Deleted

## 2020-02-11 NOTE — Telephone Encounter (Signed)
  Follow up Call-  Call back number 02/07/2020  Post procedure Call Back phone  # [phone]  Permission to leave phone message Yes  Some recent data might be hidden     Patient questions:  Do you have a fever, pain , or abdominal swelling? No. Pain Score  0 *  Have you tolerated food without any problems? Yes.    Have you been able to return to your normal activities? Yes.    Do you have any questions about your discharge instructions: Diet   No. Medications  No. Follow up visit  No.  Do you have questions or concerns about your Care? No.  Actions: * If pain score is 4 or above: No action needed, pain <4.  1. Have you developed a fever since your procedure? no  2.   Have you had an respiratory symptoms (SOB or cough) since your procedure? no  3.   Have you tested positive for COVID 19 since your procedure no  4.   Have you had any family members/close contacts diagnosed with the COVID 19 since your procedure? no   If yes to any of these questions please route to Joylene John, RN and Erenest Rasher, RN

## 2020-02-13 ENCOUNTER — Encounter: Payer: Self-pay | Admitting: Gastroenterology

## 2020-03-20 ENCOUNTER — Other Ambulatory Visit: Payer: Self-pay | Admitting: Cardiology

## 2020-03-20 DIAGNOSIS — I471 Supraventricular tachycardia: Secondary | ICD-10-CM

## 2020-05-15 ENCOUNTER — Encounter: Payer: Self-pay | Admitting: Gastroenterology

## 2020-05-15 ENCOUNTER — Ambulatory Visit: Payer: BC Managed Care – PPO | Admitting: Gastroenterology

## 2020-05-15 VITALS — BP 118/78 | HR 64 | Temp 97.7°F | Ht 74.0 in | Wt 286.4 lb

## 2020-05-15 DIAGNOSIS — K580 Irritable bowel syndrome with diarrhea: Secondary | ICD-10-CM | POA: Diagnosis not present

## 2020-05-15 DIAGNOSIS — R1011 Right upper quadrant pain: Secondary | ICD-10-CM

## 2020-05-15 MED ORDER — PANTOPRAZOLE SODIUM 20 MG PO TBEC: 20.0000 mg | DELAYED_RELEASE_TABLET | Freq: Every day | ORAL | 11 refills | Status: DC

## 2020-05-15 NOTE — Progress Notes (Signed)
Chief Complaint: Abdominal pain  Referring Provider:  No ref. provider found      ASSESSMENT AND PLAN;   #1. RUQ pain. Neg EGD 01/2020 with neg SB Bx #2. IBS-D. Neg colon to TI  Plan: - Protonix 20mg  po qd #30, 11 refills. - [place] abdo complete - If neg, HIDA with EF - If still with problems, trial of bentyl   HPI:    Alexander Travis is a 54 y.o. male  For follow-up visit. Has been having RUQ pain-sharp, nonradiating, worse after eating fatty foods Diarrhea 1/day  Bloating Very much concerned about gallbladder etiology.  No jaundice dark urine or pale stools.  No fever chills or night sweats.  No nausea, vomiting  Mom and dad - GB problems-had cholecystectomy  Overall does feel better  Had an episode of palpitations requiring ED visit.  MI was ruled out.  He is being closely followed by Dr. Bettina Gavia.  No further weight loss  Wt Readings from Last 3 Encounters:  05/15/20 286 lb 6 oz (129.9 kg)  02/07/20 285 lb (129.3 kg)  01/11/20 287 lb (130.2 kg)     Past GI procedures: -EGD 02/07/2020: -Minimal hiatal hernia -Mild gastritis. -A single submucosal papule (nodule) found in the stomach. -Neg Bx including SB Bx. -Repeat EGD in 3 years to evaluate submucosal nodule.  Colonoscopy 02/07/2020: Small hyperplastic polyps s/p polypectomy.  Otherwise normal to TI.  Repeat colon in 10 years.  Random colonic biopsies were not performed due to nonsuspicion for microscopic colitis. Past Medical History:  Diagnosis Date  . Cancer ([facility]) 1999   renal cell in left kidney  . GERD (gastroesophageal reflux disease)   . History of kidney cancer    dx 09/1998  . Palpitation 11/19/2015   Overview:  Seen in ED 11/11/15 without arrhythmia, CBC and CMP were normal  . PSVT (paroxysmal supraventricular tachycardia) ([facility])   . Sleep apnea    uses cpap    Past Surgical History:  Procedure Laterality Date  . ESOPHAGOGASTRODUODENOSCOPY  10/23/2003   Minimal transiet hiatal hernia.  Otherwise normal esophagogastroduodenoscopy   . KIDNEY SURGERY    . KNEE ARTHROSCOPY WITH MEDIAL MENISECTOMY Right 05/30/2018   Procedure: RIGHT KNEE ARTHROSCOPY WITH MEDIAL MENISECTOMY;  Surgeon: Garald Balding, MD;  Location: [facility];  Service: Orthopedics;  Laterality: Right;  . lung biospy Left    x2  . WRIST SURGERY Right     Family History  Problem Relation Age of Onset  . Heart disease Mother   . Heart disease Father   . Colon cancer Neg Hx   . Esophageal cancer Neg Hx     Social History   Tobacco Use  . Smoking status: Never Smoker  . Smokeless tobacco: Never Used  Vaping Use  . Vaping Use: Never used  Substance Use Topics  . Alcohol use: No  . Drug use: No    Current Outpatient Medications  Medication Sig Dispense Refill  . flecainide (TAMBOCOR) 50 MG tablet Take 50 mg by mouth 2 (two) times daily.    . metoprolol tartrate (LOPRESSOR) 25 MG tablet TAKE 1 TABLET BY MOUTH TWICE A DAY 180 tablet 2  . pantoprazole (PROTONIX) 20 MG tablet Take 1 tablet (20 mg total) by mouth daily. 30 tablet 11   No current facility-administered medications for this visit.    Allergies  Allergen Reactions  . Codeine Other (See Comments)    Hematuria    Review of Systems:  neg  Physical Exam:    BP 118/78   Pulse 64   Temp 97.7 F (36.5 C)   Ht 6\' 2"  (1.88 m)   Wt 286 lb 6 oz (129.9 kg)   BMI 36.77 kg/m  Filed Weights   05/15/20 1056  Weight: 286 lb 6 oz (129.9 kg)   Constitutional:  Well-developed, in no acute distress. Psychiatric: Normal mood and affect. Behavior is normal. HEENT: Pupils normal.  Conjunctivae are normal. No scleral icterus. Neck supple.  Cardiovascular: Normal rate, regular rhythm. No edema Pulmonary/chest: Effort normal and breath sounds normal. No wheezing, rales or rhonchi. Abdominal: Soft, nondistended. Nontender. Bowel sounds active throughout. There are no masses palpable. No hepatomegaly. Rectal:  defered Neurological: Alert and  oriented to person place and time. Skin: Skin is warm and dry. No rashes noted.  Data Reviewed: I have personally reviewed following labs and imaging studies  CBC: CBC Latest Ref Rng & Units 05/30/2018  WBC 4.0 - 10.5 K/uL 7.9  Hemoglobin 13.0 - 17.0 g/dL 15.3  Hematocrit 39 - 52 % 47.5  Platelets 150 - 400 K/uL 226    CMP: CMP Latest Ref Rng & Units 01/11/2020 10/11/2018 05/30/2018  Glucose 65 - 99 mg/dL 85 83 121(H)  BUN 6 - 24 mg/dL 14 12 12   Creatinine 0.76 - 1.27 mg/dL 1.12 1.24 1.37(H)  Sodium 134 - 144 mmol/L 142 142 140  Potassium 3.5 - 5.2 mmol/L 4.5 4.8 4.0  Chloride 96 - 106 mmol/L 103 98 104  CO2 20 - 29 mmol/L 24 23 25   Calcium 8.7 - 10.2 mg/dL 9.4 10.3(H) 9.4  Total Protein 6.0 - 8.5 g/dL 7.2 - -  Total Bilirubin 0.0 - 1.2 mg/dL 0.5 - -  Alkaline Phos 39 - 117 IU/L 104 - -  AST 0 - 40 IU/L 20 - -  ALT 0 - 44 IU/L 17 - -     Carmell Austria, MD 05/15/2020, 11:18 AM  Cc: Dr. Shirlee More.

## 2020-05-15 NOTE — Patient Instructions (Signed)
If you are age 56 or older, your body mass index should be between 23-30. Your Body mass index is 36.77 kg/m. If this is out of the aforementioned range listed, please consider follow up with your Primary Care Provider.  If you are age 55 or younger, your body mass index should be between 19-25. Your Body mass index is 36.77 kg/m. If this is out of the aformentioned range listed, please consider follow up with your Primary Care Provider.   You have been scheduled for an abdominal ultrasound at [facility] on 05/28/20 at 10 am. Please arrive 15 minutes prior to your appointment for registration. Make certain not to have anything to eat or drink 6 hours prior to your appointment. Should you need to reschedule your appointment, please contact radiology at [phone]. This test typically takes about 30 minutes to perform.  We have sent the following medications to your pharmacy for you to pick up at your convenience: Protonix 20 mg  Thank you for choosing me and [facility].   Jackquline Denmark, MD

## 2020-05-22 ENCOUNTER — Other Ambulatory Visit: Payer: Self-pay | Admitting: Cardiology

## 2020-05-28 ENCOUNTER — Ambulatory Visit (HOSPITAL_BASED_OUTPATIENT_CLINIC_OR_DEPARTMENT_OTHER)
Admission: RE | Admit: 2020-05-28 | Discharge: 2020-05-28 | Disposition: A | Discharge status: Home / Self Care | Payer: BC Managed Care – PPO | Source: Ambulatory Visit | Attending: Gastroenterology | Admitting: Gastroenterology

## 2020-05-28 ENCOUNTER — Other Ambulatory Visit: Payer: Self-pay

## 2020-05-28 DIAGNOSIS — R1011 Right upper quadrant pain: Secondary | ICD-10-CM

## 2020-06-01 NOTE — Progress Notes (Signed)
Please inform the patient. Ultrasound shows cholelithiasis.  Looks like he is having biliary colic.  He has a strong FH too.  Plan: -Refer to Dr Amalia Hailey (in [place]) for laparoscopic cholecystectomy with IOC and liver biopsy (fatty liver on [place])  Send report to family physician

## 2020-06-02 ENCOUNTER — Telehealth: Payer: Self-pay | Admitting: Gastroenterology

## 2020-06-02 NOTE — Telephone Encounter (Signed)
Spoke to patient. See note.  

## 2020-06-09 HISTORY — PX: CHOLECYSTECTOMY: SHX55

## 2020-09-23 ENCOUNTER — Other Ambulatory Visit: Payer: Self-pay | Admitting: Hematology and Oncology

## 2020-09-23 DIAGNOSIS — Z85528 Personal history of other malignant neoplasm of kidney: Secondary | ICD-10-CM

## 2020-10-01 NOTE — Progress Notes (Signed)
[name]  [address] [place],  [place]  [address] [phone]  Clinic Day:  10/02/2020  Referring physician: No ref. provider found   This document serves as a record of services personally performed by Hosie Poisson, MD. It was created on their behalf by Curry,Lauren E, a trained medical scribe. The creation of this record is based on the scribe's personal observations and the provider's statements to them.   CHIEF COMPLAINT:  CC: History of stage I renal cell carcinoma  Current Treatment:  Surveillance   HISTORY OF PRESENT ILLNESS:  Alexander Travis is a 54 y.o. 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 He was treated with left nephrectomy.  Pathology revealed a 6.1 cm renal cell carcinoma.  He has never had evidence of recurrence.  He has had transient lung nodules on CT scan, but underwent resection of a nodule, which was found to be benign.  In 2015, he developed hematuria, so underwent a CT chest, abdomen and pelvis, which did not reveal any evidence for recurrent, metastatic, or new malignancy.  He was seen by Dr. Comer Locket and underwent cystoscopy, which was apparently negative.  He does not have a primary care provider, but is followed by his cardiologist.  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  Dr. Bettina Gavia has done blood work, as well as an EKG in the past, which were apparently normal.    INTERVAL HISTORY:  Alexander Travis is here for follow up and states that he has been well.  He does not have a primary care physician at this time.  X-ray imaging from May at [facility] revealed stable elevated left hemidiaphragm with possible left basilar atelectasis.  I will schedule him for CT chest to reassess the lung findings as the patient is concerned.  He underwent cholecystectomy on July 26th with Dr. Kendell Bane, and has been doing well  following the procedure.  His  appetite is good, and he has gained 13 pounds since his last visit.  He denies fever, chills or other signs of infection.  He denies nausea, vomiting, bowel issues, or abdominal pain.  He denies sore throat, cough, dyspnea, or chest pain.   REVIEW OF SYSTEMS:  Review of Systems  All other systems reviewed and are negative.    VITALS:  Blood pressure 133/76, pulse 64, temperature 98.2 F (36.8 C), temperature source Oral, resp. rate 18, height 6\' 2"  (1.88 m), weight 289 lb 8 oz (131.3 kg), SpO2 94 %.  Wt Readings from Last 3 Encounters:  10/02/20 289 lb 8 oz (131.3 kg)  05/15/20 286 lb 6 oz (129.9 kg)  02/07/20 285 lb (129.3 kg)    Body mass index is 37.17 kg/m.  Performance status (ECOG): 0 - Asymptomatic  PHYSICAL EXAM:  Physical Exam Constitutional:      General: He is not in acute distress.    Appearance: Normal appearance. He is normal weight.  HENT:     Head: Normocephalic and atraumatic.  Eyes:     General: No scleral icterus.    Extraocular Movements: Extraocular movements intact.     Conjunctiva/sclera: Conjunctivae normal.     Pupils: Pupils are equal, round, and reactive to light.  Cardiovascular:     Rate and Rhythm: Normal rate and regular rhythm.     Pulses: Normal pulses.     Heart sounds: Normal heart sounds. No  murmur heard.  No friction rub. No gallop.   Pulmonary:     Effort: Pulmonary effort is normal. No respiratory distress.     Breath sounds: Normal breath sounds.  Abdominal:     General: Bowel sounds are normal. There is no distension.     Palpations: Abdomen is soft. There is no mass.     Tenderness: There is no abdominal tenderness.  Musculoskeletal:        General: Normal range of motion.     Cervical back: Normal range of motion and neck supple.     Right lower leg: No edema.     Left lower leg: No edema.  Lymphadenopathy:     Cervical: No cervical adenopathy.  Skin:    General: Skin is warm and dry.   Neurological:     General: No focal deficit present.     Mental Status: He is alert and oriented to person, place, and time. Mental status is at baseline.  Psychiatric:        Mood and Affect: Mood normal.        Behavior: Behavior normal.        Thought Content: Thought content normal.        Judgment: Judgment normal.     LABS:   CBC Latest Ref Rng & Units 05/30/2018  WBC 4.0 - 10.5 K/uL 7.9  Hemoglobin 13.0 - 17.0 g/dL 15.3  Hematocrit 39 - 52 % 47.5  Platelets 150 - 400 K/uL 226   CMP Latest Ref Rng & Units 01/11/2020 10/11/2018 05/30/2018  Glucose 65 - 99 mg/dL 85 83 121(H)  BUN 6 - 24 mg/dL 14 12 12   Creatinine 0.76 - 1.27 mg/dL 1.12 1.24 1.37(H)  Sodium 134 - 144 mmol/L 142 142 140  Potassium 3.5 - 5.2 mmol/L 4.5 4.8 4.0  Chloride 96 - 106 mmol/L 103 98 104  CO2 20 - 29 mmol/L 24 23 25   Calcium 8.7 - 10.2 mg/dL 9.4 10.3(H) 9.4  Total Protein 6.0 - 8.5 g/dL 7.2 - -  Total Bilirubin 0.0 - 1.2 mg/dL 0.5 - -  Alkaline Phos 39 - 117 IU/L 104 - -  AST 0 - 40 IU/L 20 - -  ALT 0 - 44 IU/L 17 - -     STUDIES:   He underwent a chest-2 view on 04/07/2020 showing stable elevated left hemidiaphragm with possible left basilar atelectasis.  Allergies:  Allergies  Allergen Reactions   Codeine Other (See Comments)    Hematuria    Current Medications: Current Outpatient Medications  Medication Sig Dispense Refill   flecainide (TAMBOCOR) 50 MG tablet Take 50 mg by mouth 2 (two) times daily.     metoprolol tartrate (LOPRESSOR) 25 MG tablet TAKE 1 TABLET BY MOUTH TWICE A DAY 180 tablet 2   pantoprazole (PROTONIX) 20 MG tablet Take 1 tablet (20 mg total) by mouth daily. 30 tablet 11   No current facility-administered medications for this visit.     ASSESSMENT & PLAN:   Assessment:   1.  History of stage I kidney cancer diagnosed in November 1999, treated with left nephrectomy.  He remains without obvious evidence of recurrence.  2.  Borderline cardiomegaly with  chronic left lung base scarring, as seen on chest x-ray from April 2020.  Due to the persistent atelectasis and elevated hemidiaphragm I will order a CT chest and call him on the results.  Plan: I will schedule him for CT chest to reassess the lung findings from x-ray imaging  in May.  Otherwise, we will plan to see him back in 1 year with a CBC and comprehensive metabolic panel.  The patient understands the plans discussed today and is in agreement with them.  He knows to contact our office if he develops concerns prior to his next visit.   I provided 10 minutes of face-to-face time during this this encounter and > 50% was spent counseling as documented under my assessment and plan.    Derwood Kaplan, MD [facility] [address] [place] [address] Dept: [phone] Dept Fax: [phone]   I, Rita Ohara, am acting as scribe for Derwood Kaplan, MD  I have reviewed this report as typed by the medical scribe, and it is complete and accurate.

## 2020-10-02 ENCOUNTER — Inpatient Hospital Stay: Payer: BC Managed Care – PPO | Attending: Oncology

## 2020-10-02 ENCOUNTER — Encounter: Payer: Self-pay | Admitting: Oncology

## 2020-10-02 ENCOUNTER — Other Ambulatory Visit: Payer: Self-pay | Admitting: Oncology

## 2020-10-02 ENCOUNTER — Other Ambulatory Visit: Payer: Self-pay

## 2020-10-02 ENCOUNTER — Inpatient Hospital Stay: Payer: BC Managed Care – PPO | Admitting: Oncology

## 2020-10-02 VITALS — BP 133/76 | HR 64 | Temp 98.2°F | Resp 18 | Ht 74.0 in | Wt 289.5 lb

## 2020-10-02 DIAGNOSIS — J984 Other disorders of lung: Secondary | ICD-10-CM

## 2020-10-02 DIAGNOSIS — Z85528 Personal history of other malignant neoplasm of kidney: Secondary | ICD-10-CM | POA: Diagnosis not present

## 2020-10-02 DIAGNOSIS — C642 Malignant neoplasm of left kidney, except renal pelvis: Secondary | ICD-10-CM

## 2020-10-02 DIAGNOSIS — R918 Other nonspecific abnormal finding of lung field: Secondary | ICD-10-CM

## 2020-10-02 LAB — CMP (CANCER CENTER ONLY)
ALT: 25 U/L (ref 0–44)
AST: 18 U/L (ref 15–41)
Albumin: 4.3 g/dL (ref 3.5–5.0)
Alkaline Phosphatase: 90 U/L (ref 38–126)
Anion gap: 10 (ref 5–15)
BUN: 15 mg/dL (ref 6–20)
CO2: 25 mmol/L (ref 22–32)
Calcium: 9.4 mg/dL (ref 8.9–10.3)
Chloride: 104 mmol/L (ref 98–111)
Creatinine: 1.19 mg/dL (ref 0.61–1.24)
GFR, Estimated: >60 mL/min (ref >60)
Glucose, Bld: 106 mg/dL — ABNORMAL HIGH (ref 70–99)
Potassium: 4.4 mmol/L (ref 3.5–5.1)
Sodium: 139 mmol/L (ref 135–145)
Total Bilirubin: 0.8 mg/dL (ref 0.3–1.2)
Total Protein: 7.9 g/dL (ref 6.5–8.1)

## 2020-10-02 LAB — CBC WITH DIFFERENTIAL (CANCER CENTER ONLY)
Abs Immature Granulocytes: 0.03 10*3/uL (ref 0.00–0.07)
Basophils Absolute: 0.1 10*3/uL (ref 0.0–0.1)
Basophils Relative: 1 %
Eosinophils Absolute: 0.3 10*3/uL (ref 0.0–0.5)
Eosinophils Relative: 4 %
HCT: 50.4 % (ref 39.0–52.0)
Hemoglobin: 16.3 g/dL (ref 13.0–17.0)
Immature Granulocytes: 0 %
Lymphocytes Relative: 28 %
Lymphs Abs: 2.5 10*3/uL (ref 0.7–4.0)
MCH: 30.6 pg (ref 26.0–34.0)
MCHC: 32.3 g/dL (ref 30.0–36.0)
MCV: 94.6 fL (ref 80.0–100.0)
Monocytes Absolute: 0.6 10*3/uL (ref 0.1–1.0)
Monocytes Relative: 7 %
Neutro Abs: 5.3 10*3/uL (ref 1.7–7.7)
Neutrophils Relative %: 60 %
Platelet Count: 276 10*3/uL (ref 150–400)
RBC: 5.33 MIL/uL (ref 4.22–5.81)
RDW: 12.9 % (ref 11.5–15.5)
WBC Count: 8.9 10*3/uL (ref 4.0–10.5)
nRBC: 0 % (ref 0.0–0.2)

## 2020-10-03 IMAGING — US US ABDOMEN COMPLETE
1 series · 14 of 25 positions shown · non-contrast
Comparison: 05/01/2015 CT urogram.

CLINICAL DATA: Right upper quadrant pain.

EXAM:
ABDOMEN ULTRASOUND COMPLETE

[Series 1: us abdomen complete · 14 of 50 slices shown]
[im 1/50]
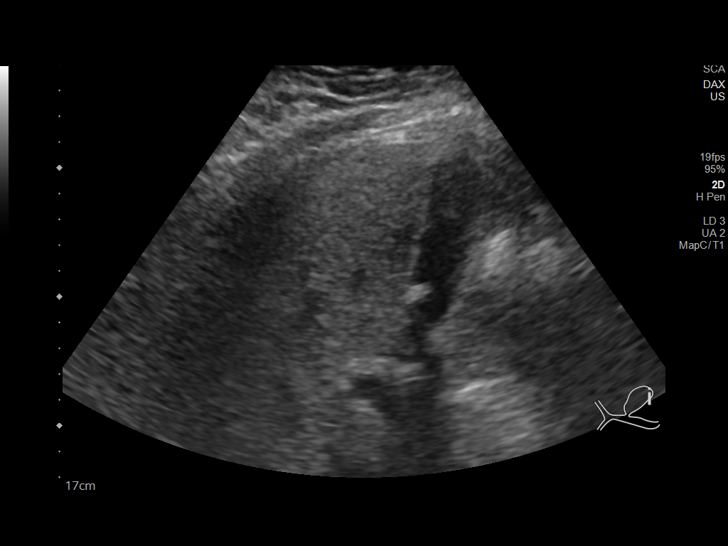
[im 5/50]
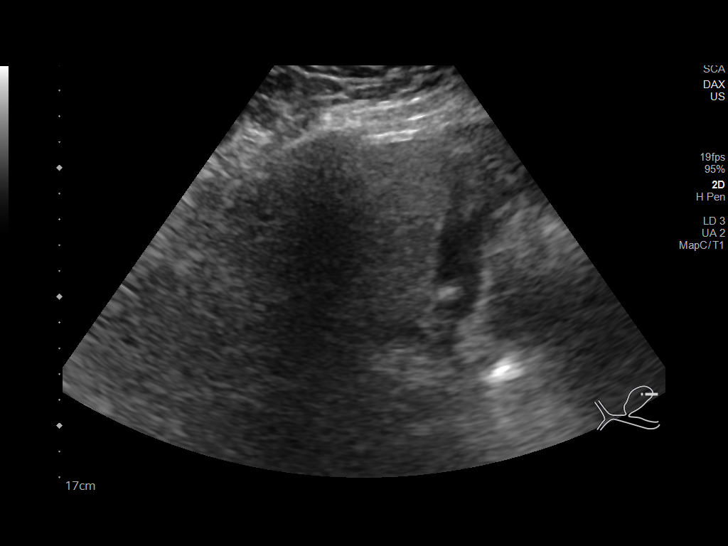
[im 9/50]
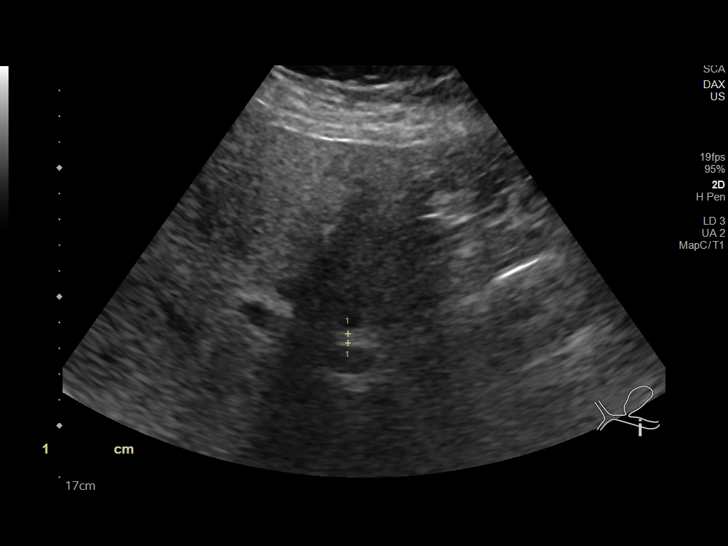
[im 13/50]
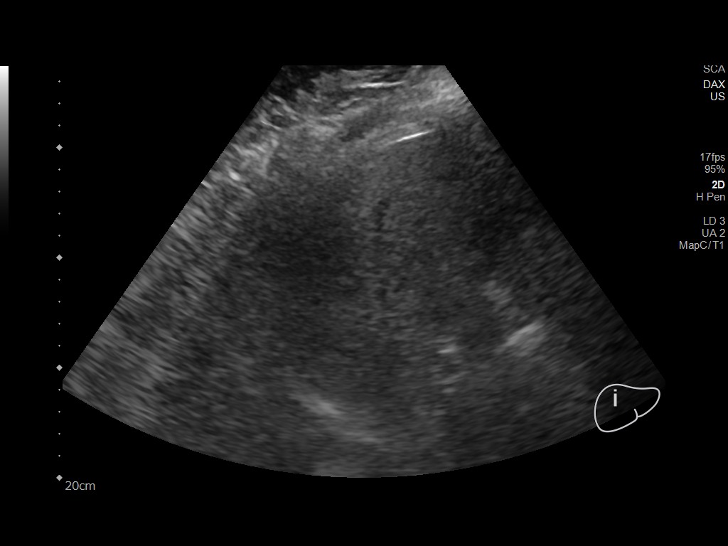
[im 17/50]
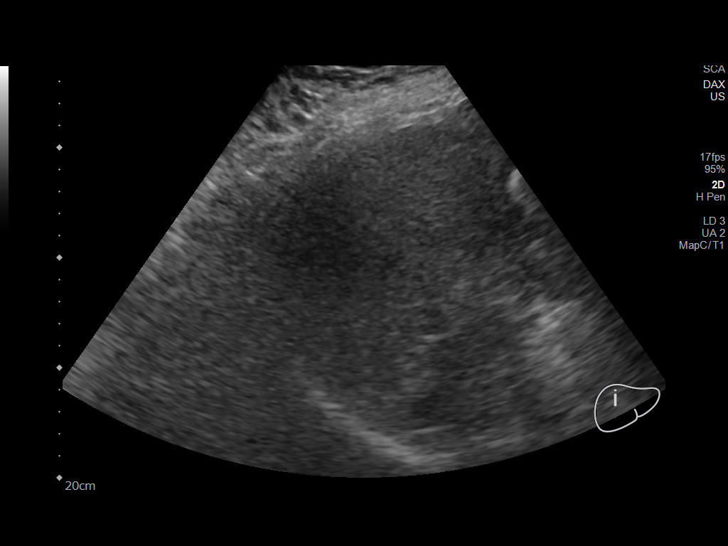
[im 19/50]
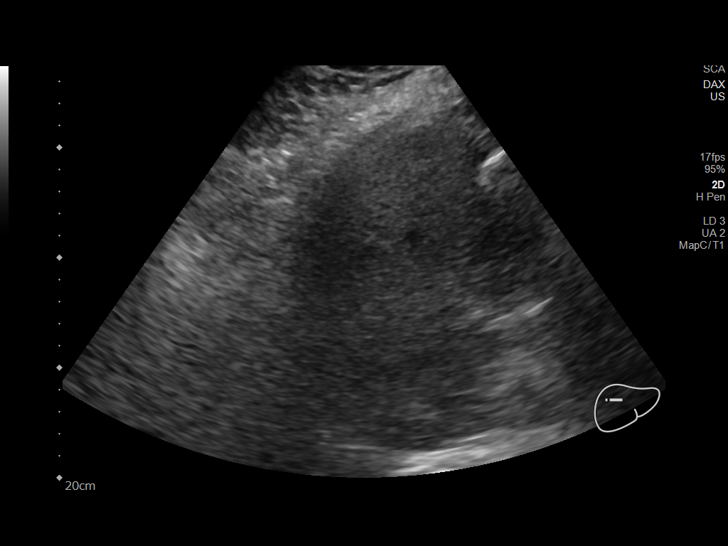
[im 23/50]
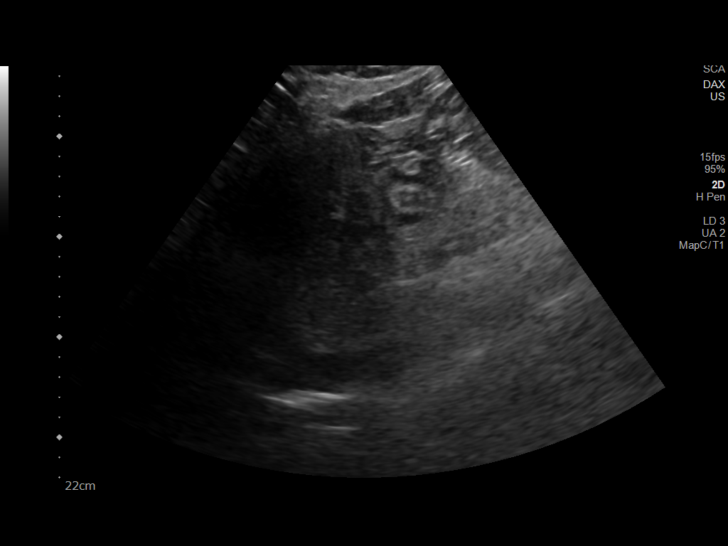
[im 27/50]
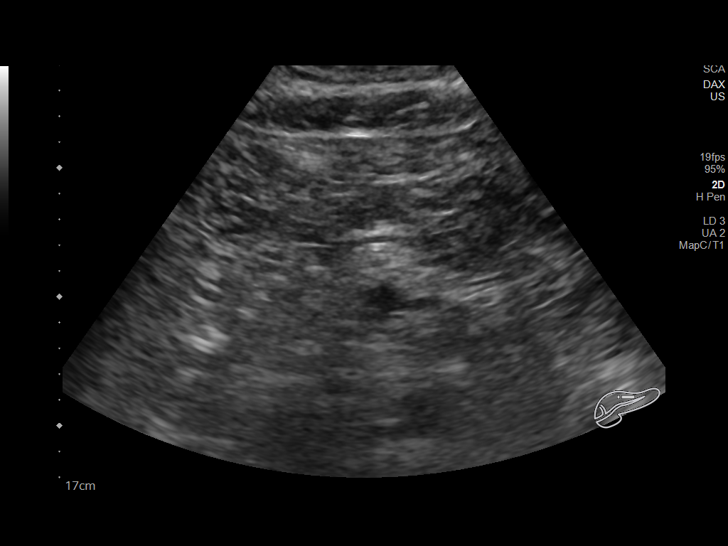
[im 31/50]
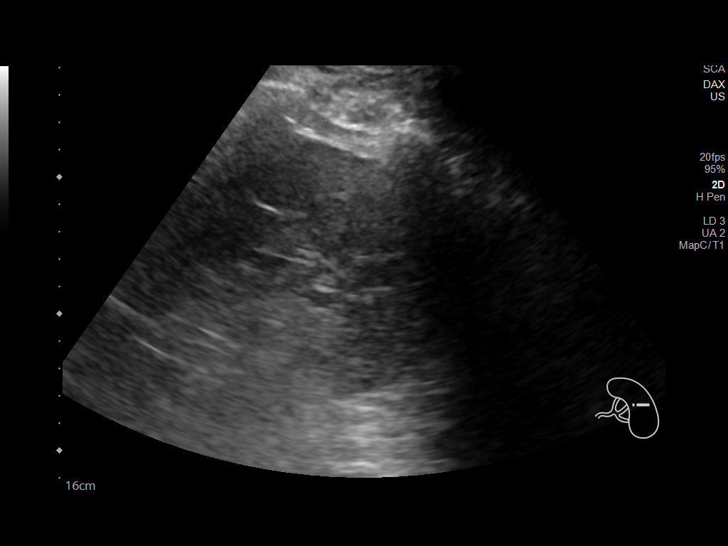
[im 33/50]
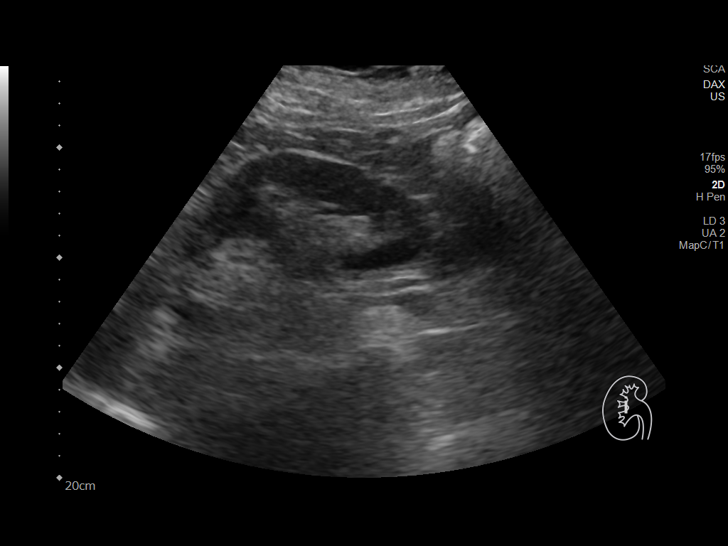
[im 37/50]
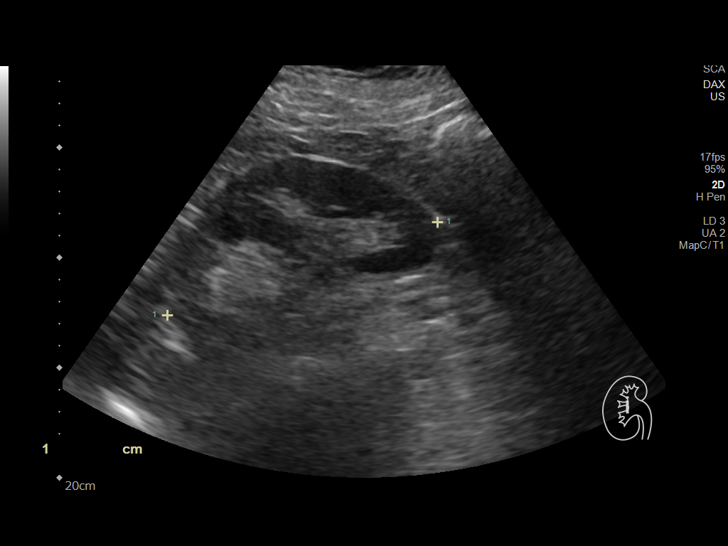
[im 41/50]
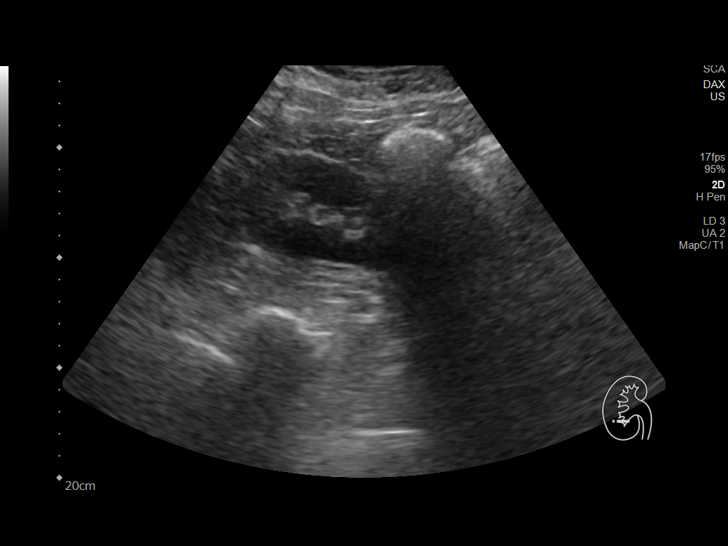
[im 45/50]
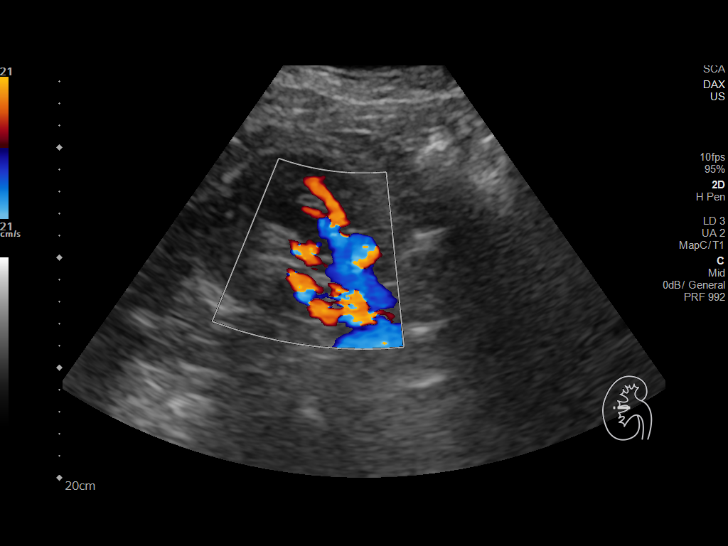
[im 50/50]
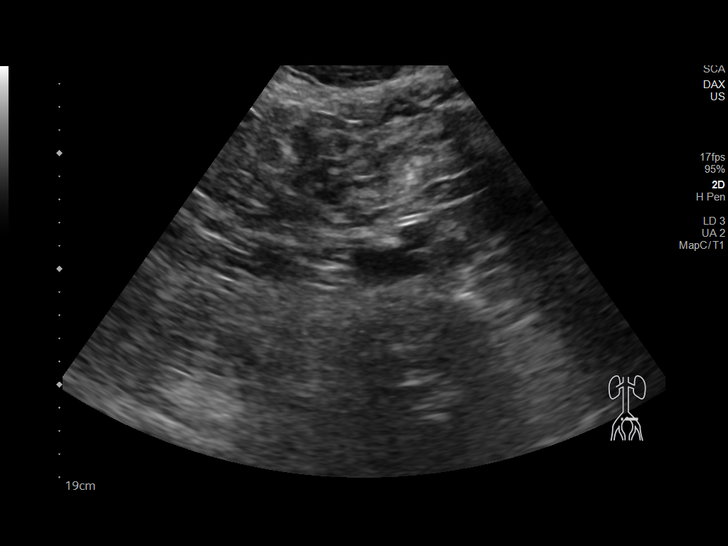

[14 of 25 positions shown; findings below may reference images not displayed]

FINDINGS: Gallbladder: Intraluminal calculus proximal to the neck measuring up
to 1.2 cm. Prominent appearance of the gallbladder wall likely
secondary to partial decompression. No sonographic Murphy sign noted
by sonographer.

Common bile duct: Diameter: 3.6 mm, within normal limits.

Liver: No focal lesion identified. Increased parenchymal
echogenicity with heterogenous echotexture. Portal vein is patent on
color Doppler imaging with normal direction of blood flow towards
the liver.

IVC: No abnormality visualized.

Pancreas: Visualized portion unremarkable.

Spleen: Size and appearance within normal limits.

Right Kidney: Length: 13 cm. Echogenicity within normal limits. No
mass or hydronephrosis visualized.

Left Kidney: Surgically absent.

Abdominal aorta: No aneurysm visualized.

Other findings: None.
IMPRESSION: Hepatic steatosis. No focal hepatic lesion identified. Please note
that ultrasound is limited in its evaluation of a heterogenous liver
for focal lesions.

Cholelithiasis without cholecystitis.

Sequela of left nephrectomy.

## 2020-12-16 ENCOUNTER — Other Ambulatory Visit: Payer: Self-pay | Admitting: Cardiology

## 2020-12-16 DIAGNOSIS — I471 Supraventricular tachycardia: Secondary | ICD-10-CM

## 2020-12-16 NOTE — Telephone Encounter (Signed)
Refill sent to pharmacy.   

## 2021-01-08 ENCOUNTER — Encounter (HOSPITAL_BASED_OUTPATIENT_CLINIC_OR_DEPARTMENT_OTHER): Payer: Self-pay

## 2021-01-08 ENCOUNTER — Other Ambulatory Visit: Payer: Self-pay

## 2021-01-08 ENCOUNTER — Emergency Department (HOSPITAL_BASED_OUTPATIENT_CLINIC_OR_DEPARTMENT_OTHER): Payer: BC Managed Care – PPO

## 2021-01-08 ENCOUNTER — Emergency Department (HOSPITAL_BASED_OUTPATIENT_CLINIC_OR_DEPARTMENT_OTHER)
Admission: EM | Admit: 2021-01-08 | Discharge: 2021-01-08 | Disposition: A | Discharge status: Home / Self Care | Payer: BC Managed Care – PPO | Attending: Emergency Medicine | Admitting: Emergency Medicine

## 2021-01-08 DIAGNOSIS — Z85528 Personal history of other malignant neoplasm of kidney: Secondary | ICD-10-CM | POA: Diagnosis not present

## 2021-01-08 DIAGNOSIS — M25512 Pain in left shoulder: Secondary | ICD-10-CM | POA: Diagnosis present

## 2021-01-08 LAB — BASIC METABOLIC PANEL
Anion gap: 10 (ref 5–15)
BUN: 15 mg/dL (ref 6–20)
CO2: 25 mmol/L (ref 22–32)
Calcium: 9.5 mg/dL (ref 8.9–10.3)
Chloride: 102 mmol/L (ref 98–111)
Creatinine, Ser: 1.09 mg/dL (ref 0.61–1.24)
GFR, Estimated: >60 mL/min (ref >60)
Glucose, Bld: 94 mg/dL (ref 70–99)
Potassium: 4.1 mmol/L (ref 3.5–5.1)
Sodium: 137 mmol/L (ref 135–145)

## 2021-01-08 LAB — TROPONIN I (HIGH SENSITIVITY)
Troponin I (High Sensitivity): 3 ng/L (ref <18)
Troponin I (High Sensitivity): 3 ng/L (ref <18)

## 2021-01-08 LAB — CBC
HCT: 48.4 % (ref 39.0–52.0)
Hemoglobin: 16.3 g/dL (ref 13.0–17.0)
MCH: 30.6 pg (ref 26.0–34.0)
MCHC: 33.7 g/dL (ref 30.0–36.0)
MCV: 91 fL (ref 80.0–100.0)
Platelets: 252 10*3/uL (ref 150–400)
RBC: 5.32 MIL/uL (ref 4.22–5.81)
RDW: 12.8 % (ref 11.5–15.5)
WBC: 8.7 10*3/uL (ref 4.0–10.5)
nRBC: 0 % (ref 0.0–0.2)

## 2021-01-08 LAB — BRAIN NATRIURETIC PEPTIDE: B Natriuretic Peptide: 26.4 pg/mL (ref 0.0–100.0)

## 2021-01-08 MED ORDER — DICLOFENAC SODIUM 1 % EX GEL: 2.0000 g | Freq: Four times a day (QID) | CUTANEOUS | 0 refills | Status: DC

## 2021-01-08 MED ORDER — LIDOCAINE 5 % EX PTCH: 1.0000 | MEDICATED_PATCH | CUTANEOUS | 0 refills | Status: DC

## 2021-01-08 NOTE — ED Notes (Signed)
Placed on cont cardiac monitoring, NSR noted, with int NBP assessment with cont POX monitoring

## 2021-01-08 NOTE — Discharge Instructions (Addendum)
You were evaluated in the Emergency Department and after careful evaluation, we did not find any emergent condition requiring admission or further testing in the hospital.  Please use the Voltaren gel Lidoderm patch along with Tylenol or ibuprofen for pain.  Please return to the Emergency Department if you experience any worsening of your condition.  We encourage you to follow up with a primary care provider.  Thank you for allowing [place] to be a part of your care.

## 2021-01-08 NOTE — ED Notes (Signed)
Presents with left arm pain, radiates from left forearm to left shoulder, denies any injuries or falls.

## 2021-01-08 NOTE — ED Notes (Signed)
Patient transported to X-ray 

## 2021-01-08 NOTE — ED Notes (Signed)
EKG x 2 by EMT due to did not print on portable EKG machine-result in epic-EMT to print another option for EDP

## 2021-01-08 NOTE — ED Provider Notes (Signed)
[facility] EMERGENCY DEPARTMENT Provider Note   CSN: [id number] Arrival date & time: 01/08/21  1437     History Chief Complaint  Patient presents with  . Arm Pain    Alexander Travis is a 55 y.o. male.  HPI 55 year old male with a history of renal cell carcinoma, GERD, SVT, sleep apnea presents to the ER with complaints of intermittent left shoulder pain.  Patient reports that this comes and goes, but at times can be severe sharp and stabbing.  It is not reproducible, not worsened with movement.  He denies any episode of diaphoresis, though does endorse occasional nausea.  He had some shortness of breath with going up some stairs this week, but denies any chest pain, cough, shortness of breath at rest.  He has no prior history of heart attacks, though does state that it does run in his family.  He touched base with his cardiologist Dr. Bettina Gavia who recommended he come to the ER.  Per chart review, patient had also complained of varying blood pressures in the right versus left arm.  Denies any lower extremity swelling, no history of heart failure.  Denies any dizziness, syncope.    Past Medical History:  Diagnosis Date  . Cancer ([place]) 1999   renal cell in left kidney  . GERD (gastroesophageal reflux disease)   . History of kidney cancer    dx 09/1998  . Palpitation 11/19/2015   Overview:  Seen in ED 11/11/15 without arrhythmia, CBC and CMP were normal  . PSVT (paroxysmal supraventricular tachycardia) ([place])   . Sleep apnea    uses cpap    Patient Active Problem List   Diagnosis Date Noted  . Sleep apnea 03/05/2019  . Stasis dermatitis 10/11/2018  . Palpitations 11/19/2015  . PSVT (paroxysmal supraventricular tachycardia) ([place]) 05/08/2015    Past Surgical History:  Procedure Laterality Date  . CHOLECYSTECTOMY  06/09/2020  . ESOPHAGOGASTRODUODENOSCOPY  10/23/2003   Minimal transiet hiatal hernia. Otherwise normal esophagogastroduodenoscopy   . KIDNEY SURGERY    . KNEE  ARTHROSCOPY WITH MEDIAL MENISECTOMY Right 05/30/2018   Procedure: RIGHT KNEE ARTHROSCOPY WITH MEDIAL MENISECTOMY;  Surgeon: Garald Balding, MD;  Location: [facility];  Service: Orthopedics;  Laterality: Right;  . lung biospy Left    x2  . WRIST SURGERY Right        Family History  Problem Relation Age of Onset  . Heart disease Mother   . Heart disease Father   . Colon cancer Neg Hx   . Esophageal cancer Neg Hx     Social History   Tobacco Use  . Smoking status: Never Smoker  . Smokeless tobacco: Never Used  Vaping Use  . Vaping Use: Never used  Substance Use Topics  . Alcohol use: No  . Drug use: No    Home Medications Prior to Admission medications   Medication Sig Start Date End Date Taking? Authorizing Provider  diclofenac Sodium (VOLTAREN) 1 % GEL Apply 2 g topically 4 (four) times daily. 01/08/21  Yes Sharyn Lull A, PA-C  lidocaine (LIDODERM) 5 % Place 1 patch onto the skin daily. Remove & Discard patch within 12 hours or as directed by MD 01/08/21  Yes Garald Balding, PA-C  flecainide (TAMBOCOR) 50 MG tablet Take 50 mg by mouth 2 (two) times daily.    [provider]  metoprolol tartrate (LOPRESSOR) 25 MG tablet TAKE 1 TABLET BY MOUTH TWICE A DAY 12/16/20   Richardo Priest, MD  pantoprazole (PROTONIX) 20  MG tablet Take 1 tablet (20 mg total) by mouth daily. 05/15/20   Jackquline Denmark, MD    Allergies    Codeine  Review of Systems   Review of Systems  Constitutional: Negative for chills and fever.  HENT: Negative for ear pain and sore throat.   Eyes: Negative for pain and visual disturbance.  Respiratory: Positive for shortness of breath. Negative for cough.   Cardiovascular: Negative for chest pain and palpitations.  Gastrointestinal: Negative for abdominal pain and vomiting.  Genitourinary: Negative for dysuria and hematuria.  Musculoskeletal: Positive for arthralgias. Negative for back pain.  Skin: Negative for color change and rash.  Neurological:  Negative for seizures and syncope.  All other systems reviewed and are negative.   Physical Exam Updated Vital Signs BP 113/71 (BP Location: Right Arm)   Pulse 62   Temp 97.9 F (36.6 C) (Oral)   Resp 16   Ht 6\' 2"  (1.88 m)   Wt 126.6 kg   SpO2 100%   BMI 35.82 kg/m   Physical Exam Vitals and nursing note reviewed.  Constitutional:      Appearance: He is well-developed and well-nourished.  HENT:     Head: Normocephalic and atraumatic.  Eyes:     Conjunctiva/sclera: Conjunctivae normal.  Cardiovascular:     Rate and Rhythm: Normal rate and regular rhythm.     Pulses: Normal pulses.     Heart sounds: No murmur heard.     Comments: 2+ radial pulses, 2+ carotid pulse. Pulmonary:     Effort: Pulmonary effort is normal. No respiratory distress.     Breath sounds: Normal breath sounds.  Abdominal:     General: Abdomen is flat.     Palpations: Abdomen is soft.     Tenderness: There is no abdominal tenderness.  Musculoskeletal:        General: No tenderness, deformity or edema.     Cervical back: Neck supple.     Right lower leg: No edema.     Left lower leg: No edema.     Comments: Full range of motion of left shoulder, no tenderness to palpation to the trapezius muscles or the pec muscles on the left side.  He has no midline C-spine tenderness, no associated paraspinal muscle tenderness.  No T or L-spine tenderness.  Skin:    General: Skin is warm and dry.  Neurological:     General: No focal deficit present.     Mental Status: He is alert and oriented to person, place, and time.     Sensory: No sensory deficit.     Motor: No weakness.  Psychiatric:        Mood and Affect: Mood and affect normal.      ED Results / Procedures / Treatments   Labs (all labs ordered are listed, but only abnormal results are displayed) Labs Reviewed  CBC  BASIC METABOLIC PANEL  BRAIN NATRIURETIC PEPTIDE  TROPONIN I (HIGH SENSITIVITY)  TROPONIN I (HIGH SENSITIVITY)    EKG EKG  Interpretation  Date/Time:  Thursday January 08 2021 14:48:34 EST Ventricular Rate:  64 PR Interval:  190 QRS Duration: 88 QT Interval:  406 QTC Calculation: 418 R Axis:   -49 Text Interpretation: Normal sinus rhythm Left anterior fascicular block Minimal voltage criteria for LVH, may be normal variant ( R in aVL ) Abnormal ECG No STEMI Confirmed by Octaviano Glow [id number]) on 01/08/2021 3:04:16 PM   Radiology DG Chest Portable 1 View  Result Date: 01/08/2021 CLINICAL DATA:  Chest pain EXAM: PORTABLE CHEST 1 VIEW COMPARISON:  02/14/2019, 04/19/2017 FINDINGS: Stable scarring at the lingula and left CP angle. Right lung is clear. Stable cardiomediastinal silhouette. No pneumothorax. IMPRESSION: No active disease. Electronically Signed   By: Donavan Foil M.D.   On: 01/08/2021 15:33   DG Shoulder Left  Result Date: 01/08/2021 CLINICAL DATA:  Left shoulder pain EXAM: LEFT SHOULDER - 2+ VIEW COMPARISON:  09/13/2005 FINDINGS: Mild AC joint degenerative change.  No fracture or malalignment. IMPRESSION: No acute osseous abnormality. Electronically Signed   By: Donavan Foil M.D.   On: 01/08/2021 18:48    Procedures Procedures   Medications Ordered in ED Medications - No data to display  ED Course  I have reviewed the triage vital signs and the nursing notes.  Pertinent labs & imaging results that were available during my care of the patient were reviewed by me and considered in my medical decision making (see chart for details).    MDM Rules/Calculators/A&P                          55 year old male presents today with left shoulder pain traveling into the neck.  On arrival, vitals reassuring, afebrile, not tachycardic, hypotensive, hypoxic.  Physical exam with full range of motion of the left shoulder, neurovascularly intact, no reproducible pain on exam.  Overall well-appearing, no focal neuro deficits.  He is asymptomatic currently in the ER.  No evidence of fluid overload on exam.  Not  tachypneic, wheezing, speaking in full sentences peer  DDx includes musculoskeletal strain, ACS, carotid dissection  Labs ordered, reviewed and interpreted by me, CBC, BMP unremarkable.  BNP normal.  Initial troponin of 3.  Chest x-ray without acute abnormalities.  EKG normal sinus rhythm.  Patient denies any neurologic symptoms, no visual changes, no dizziness, no facial droop, no word slurring.  Denies any injuries to the shoulder.  Denies any dizziness.  No recent traumas, roller coaster rides, chiropractic manipulations.  Given no focal neurologic deficits as well as no recent trauma, low suspicion for carotid dissection at this time.  Shoulder x-ray without evidence of abnormality.  Repeat troponin unchanged, 3.  No evidence of ACS.  Patient remains asymptomatic here in the ER.  Stable for discharge with PCP follow-up.  Will provide Lidoderm patch and Voltaren gel.  Encourage PCP follow-up.  Discussed return precautions.  Stable for discharge at this time.  This was a shared visit with my supervising physician Dr. Rex Kras who independently saw and evaluated the patient & provided guidance in evaluation/management/disposition ,in agreement with care  Final Clinical Impression(s) / ED Diagnoses Final diagnoses:  Acute pain of left shoulder    Rx / DC Orders ED Discharge Orders         Ordered    diclofenac Sodium (VOLTAREN) 1 % GEL  4 times daily        01/08/21 1858    lidocaine (LIDODERM) 5 %  Every 24 hours        01/08/21 1858           Lyndel Safe 01/08/21 1908    Little, Wenda Overland, MD 01/09/21 1504

## 2021-01-08 NOTE — ED Triage Notes (Signed)
Pt c/o pain to left UE x 1 week-also c/o pain left shoulder/law started yesterday-pt was advised by cards to come to ED-pt denies CP-states he did "get winded going upstairs Saturday"

## 2021-03-20 DIAGNOSIS — K219 Gastro-esophageal reflux disease without esophagitis: Secondary | ICD-10-CM | POA: Insufficient documentation

## 2021-03-20 DIAGNOSIS — Z85528 Personal history of other malignant neoplasm of kidney: Secondary | ICD-10-CM | POA: Insufficient documentation

## 2021-04-09 ENCOUNTER — Ambulatory Visit: Payer: BC Managed Care – PPO | Admitting: Cardiology

## 2021-04-09 ENCOUNTER — Other Ambulatory Visit: Payer: Self-pay

## 2021-04-09 ENCOUNTER — Encounter: Payer: Self-pay | Admitting: Cardiology

## 2021-04-09 VITALS — BP 134/80 | HR 60 | Ht 74.0 in | Wt 286.8 lb

## 2021-04-09 DIAGNOSIS — I471 Supraventricular tachycardia: Secondary | ICD-10-CM

## 2021-04-09 DIAGNOSIS — I493 Ventricular premature depolarization: Secondary | ICD-10-CM

## 2021-04-09 NOTE — Progress Notes (Signed)
Cardiology Office Note:    Date:  04/09/2021   ID:  Alexander Travis, DOB 27-Nov-1965, MRN [id number]  PCP:  Patient, No Pcp Per (Inactive)  Cardiologist:  Shirlee More, MD    Referring MD: No ref. provider found    ASSESSMENT:    1. PSVT (paroxysmal supraventricular tachycardia) ([place])   2. PVC (premature ventricular contraction)    PLAN:    In order of problems listed above:  1. He continues to do well continue current treatment low-dose no evidence of toxicity I think I can safely see him back in the office in 1 year   Next appointment: 1 year   Medication Adjustments/Labs and Tests Ordered: Current medicines are reviewed at length with the patient today.  Concerns regarding medicines are outlined above.  No orders of the defined types were placed in this encounter.  No orders of the defined types were placed in this encounter.   Chief Complaint  Patient presents with  . Follow-up    He is on flecainide for SVT and PVCs with a structurally normal heart    History of Present Illness:    Alexander Travis is a 55 y.o. male with a hx of paroxysmal SVT and symptomatic PVCs on flecainide last seen here 01/11/2020.  Compliance with diet, lifestyle and medications: Yes  Is doing very well with marked No arrhythmia no side effects no exercise intolerance palpitations syncope chest pain or shortness of breath is pleased with the quality of small EKG today sinus rhythm left anterior hemiblock no findings of 1C antiarrhythmic drug toxicity Recent labs 01/08/2021 BUN 15 creatinine 1.0 random glucose 90  Past Medical History:  Diagnosis Date  . Cancer ([place]) 1999   renal cell in left kidney  . GERD (gastroesophageal reflux disease)   . History of kidney cancer    dx 09/1998  . Palpitation 11/19/2015   Overview:  Seen in ED 11/11/15 without arrhythmia, CBC and CMP were normal  . PSVT (paroxysmal supraventricular tachycardia) ([place])   . Sleep apnea    uses cpap    Past Surgical  History:  Procedure Laterality Date  . CHOLECYSTECTOMY  06/09/2020  . ESOPHAGOGASTRODUODENOSCOPY  10/23/2003   Minimal transiet hiatal hernia. Otherwise normal esophagogastroduodenoscopy   . KIDNEY SURGERY    . KNEE ARTHROSCOPY WITH MEDIAL MENISECTOMY Right 05/30/2018   Procedure: RIGHT KNEE ARTHROSCOPY WITH MEDIAL MENISECTOMY;  Surgeon: Garald Balding, MD;  Location: [facility];  Service: Orthopedics;  Laterality: Right;  . lung biospy Left    x2  . WRIST SURGERY Right     Current Medications: Current Meds  Medication Sig  . diclofenac Sodium (VOLTAREN) 1 % GEL Apply 2 g topically 4 (four) times daily. (Patient taking differently: Apply 2 g topically 4 (four) times daily as needed (Pain).)  . flecainide (TAMBOCOR) 50 MG tablet Take 50 mg by mouth 2 (two) times daily.  Marland Kitchen lidocaine (LIDODERM) 5 % Place 1 patch onto the skin daily. Remove & Discard patch within 12 hours or as directed by MD (Patient taking differently: Place 1 patch onto the skin daily. Remove & Discard patch within 12 hours or as directed by MD / as needed for pain)  . metoprolol tartrate (LOPRESSOR) 25 MG tablet TAKE 1 TABLET BY MOUTH TWICE A DAY  . pantoprazole (PROTONIX) 20 MG tablet Take 1 tablet (20 mg total) by mouth daily.     Allergies:   Codeine   Social History   Socioeconomic History  . Marital status: Single  Spouse name: Not on file  . Number of children: Not on file  . Years of education: Not on file  . Highest education level: Not on file  Occupational History  . Not on file  Tobacco Use  . Smoking status: Never Smoker  . Smokeless tobacco: Never Used  Vaping Use  . Vaping Use: Never used  Substance and Sexual Activity  . Alcohol use: No  . Drug use: No  . Sexual activity: Not on file  Other Topics Concern  . Not on file  Social History Narrative  . Not on file   Social Determinants of Health   Financial Resource Strain: Not on file  Food Insecurity: Not on file  Transportation  Needs: Not on file  Physical Activity: Not on file  Stress: Not on file  Social Connections: Not on file     Family History: The patient's family history includes Heart disease in his father and mother. There is no history of Colon cancer or Esophageal cancer. ROS:   Please see the history of present illness.    All other systems reviewed and are negative.  EKGs/Labs/Other Studies Reviewed:    The following studies were reviewed today:  Recent Labs: 10/02/2020: ALT 25 01/08/2021: B Natriuretic Peptide 26.4; BUN 15; Creatinine, Ser 1.09; Hemoglobin 16.3; Platelets 252; Potassium 4.1; Sodium 137  Recent Lipid Panel    Component Value Date/Time   CHOL 181 03/19/2019 1456   TRIG 145 03/19/2019 1456   HDL 39 (L) 03/19/2019 1456   CHOLHDL 4.6 03/19/2019 1456   LDLCALC 113 (H) 03/19/2019 1456    Physical Exam:    VS:  BP 134/80 (BP Location: Right Arm, Cuff Size: Large)   Pulse 60   Ht 6\' 2"  (1.88 m)   Wt 286 lb 12.8 oz (130.1 kg)   SpO2 96%   BMI 36.82 kg/m     Wt Readings from Last 3 Encounters:  04/09/21 286 lb 12.8 oz (130.1 kg)  01/08/21 279 lb (126.6 kg)  10/02/20 289 lb 8 oz (131.3 kg)     GEN:  Well nourished, well developed in no acute distress HEENT: Normal NECK: No JVD; No carotid bruits LYMPHATICS: No lymphadenopathy CARDIAC: RRR, no murmurs, rubs, gallops RESPIRATORY:  Clear to auscultation without rales, wheezing or rhonchi  ABDOMEN: Soft, non-tender, non-distended MUSCULOSKELETAL:  No edema; No deformity  SKIN: Warm and dry NEUROLOGIC:  Alert and oriented x 3 PSYCHIATRIC:  Normal affect    Signed, Shirlee More, MD  04/09/2021 1:37 PM    [facility]

## 2021-04-09 NOTE — Patient Instructions (Addendum)
Medication Instructions:  Your physician recommends that you continue on your current medications as directed. Please refer to the Current Medication list given to you today.  *If you need a refill on your cardiac medications before your next appointment, please call your pharmacy*   Lab Work: None If you have labs (blood work) drawn today and your tests are completely normal, you will receive your results only by: Marland Kitchen MyChart Message (if you have MyChart) OR . A paper copy in the mail If you have any lab test that is abnormal or we need to change your treatment, we will call you to review the results.   Testing/Procedures: None   Follow-Up: At [facility], you and your health needs are our priority.  As part of our continuing mission to provide you with exceptional heart care, we have created designated Provider Care Teams.  These Care Teams include your primary Cardiologist (physician) and Advanced Practice Providers (APPs -  Physician Assistants and Nurse Practitioners) who all work together to provide you with the care you need, when you need it.  We recommend signing up for the patient portal called "MyChart".  Sign up information is provided on this After Visit Summary.  MyChart is used to connect with patients for Virtual Visits (Telemedicine).  Patients are able to view lab/test results, encounter notes, upcoming appointments, etc.  Non-urgent messages can be sent to your provider as well.   To learn more about what you can do with MyChart, go to [email or web address].    Your next appointment:   1 year(s)  The format for your next appointment:   In Person  Provider:   Shirlee More, MD   Other Instructions 1. Avoid all over-the-counter antihistamines except Claritin/Loratadine and Zyrtec/Cetrizine. 2. Avoid all combination including cold sinus allergies flu decongestant and sleep medications 3. You can use Robitussin DM Mucinex and Mucinex DM for cough. 4. can use  Tylenol aspirin ibuprofen and naproxen but no combinations such as sleep or sinus.

## 2021-05-16 IMAGING — DX DG CHEST 1V PORT
1 series · 1 of 1 positions shown · non-contrast
Comparison: 02/14/2019, 04/19/2017

CLINICAL DATA: Chest pain

EXAM:
PORTABLE CHEST 1 VIEW

[chest ap]
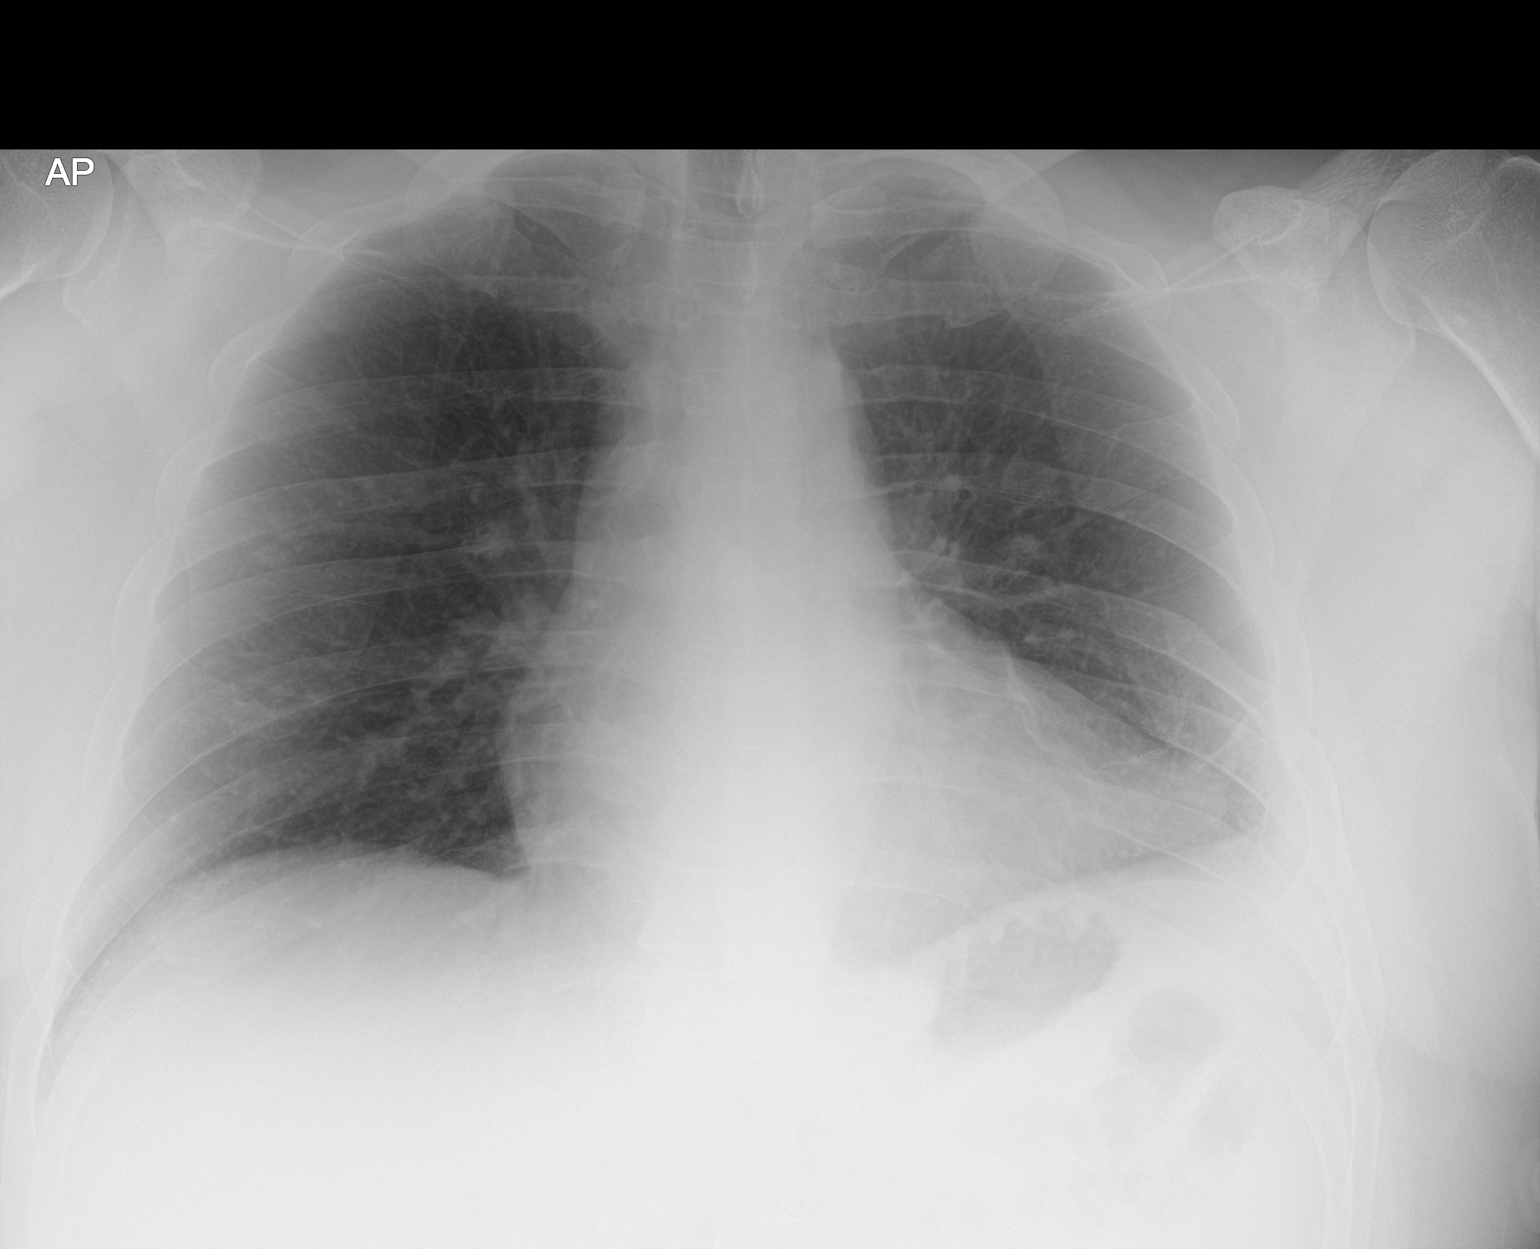

[1 of 1 positions shown; findings below may reference images not displayed]

FINDINGS: Stable scarring at the lingula and left CP angle. Right lung is
clear. Stable cardiomediastinal silhouette. No pneumothorax.
IMPRESSION: No active disease.

## 2021-05-16 IMAGING — DX DG SHOULDER 2+V*L*
3 series · 3 of 3 positions shown · non-contrast
Comparison: 09/13/2005

CLINICAL DATA: Left shoulder pain

EXAM:
LEFT SHOULDER - 2+ VIEW

[shoulder grashey]
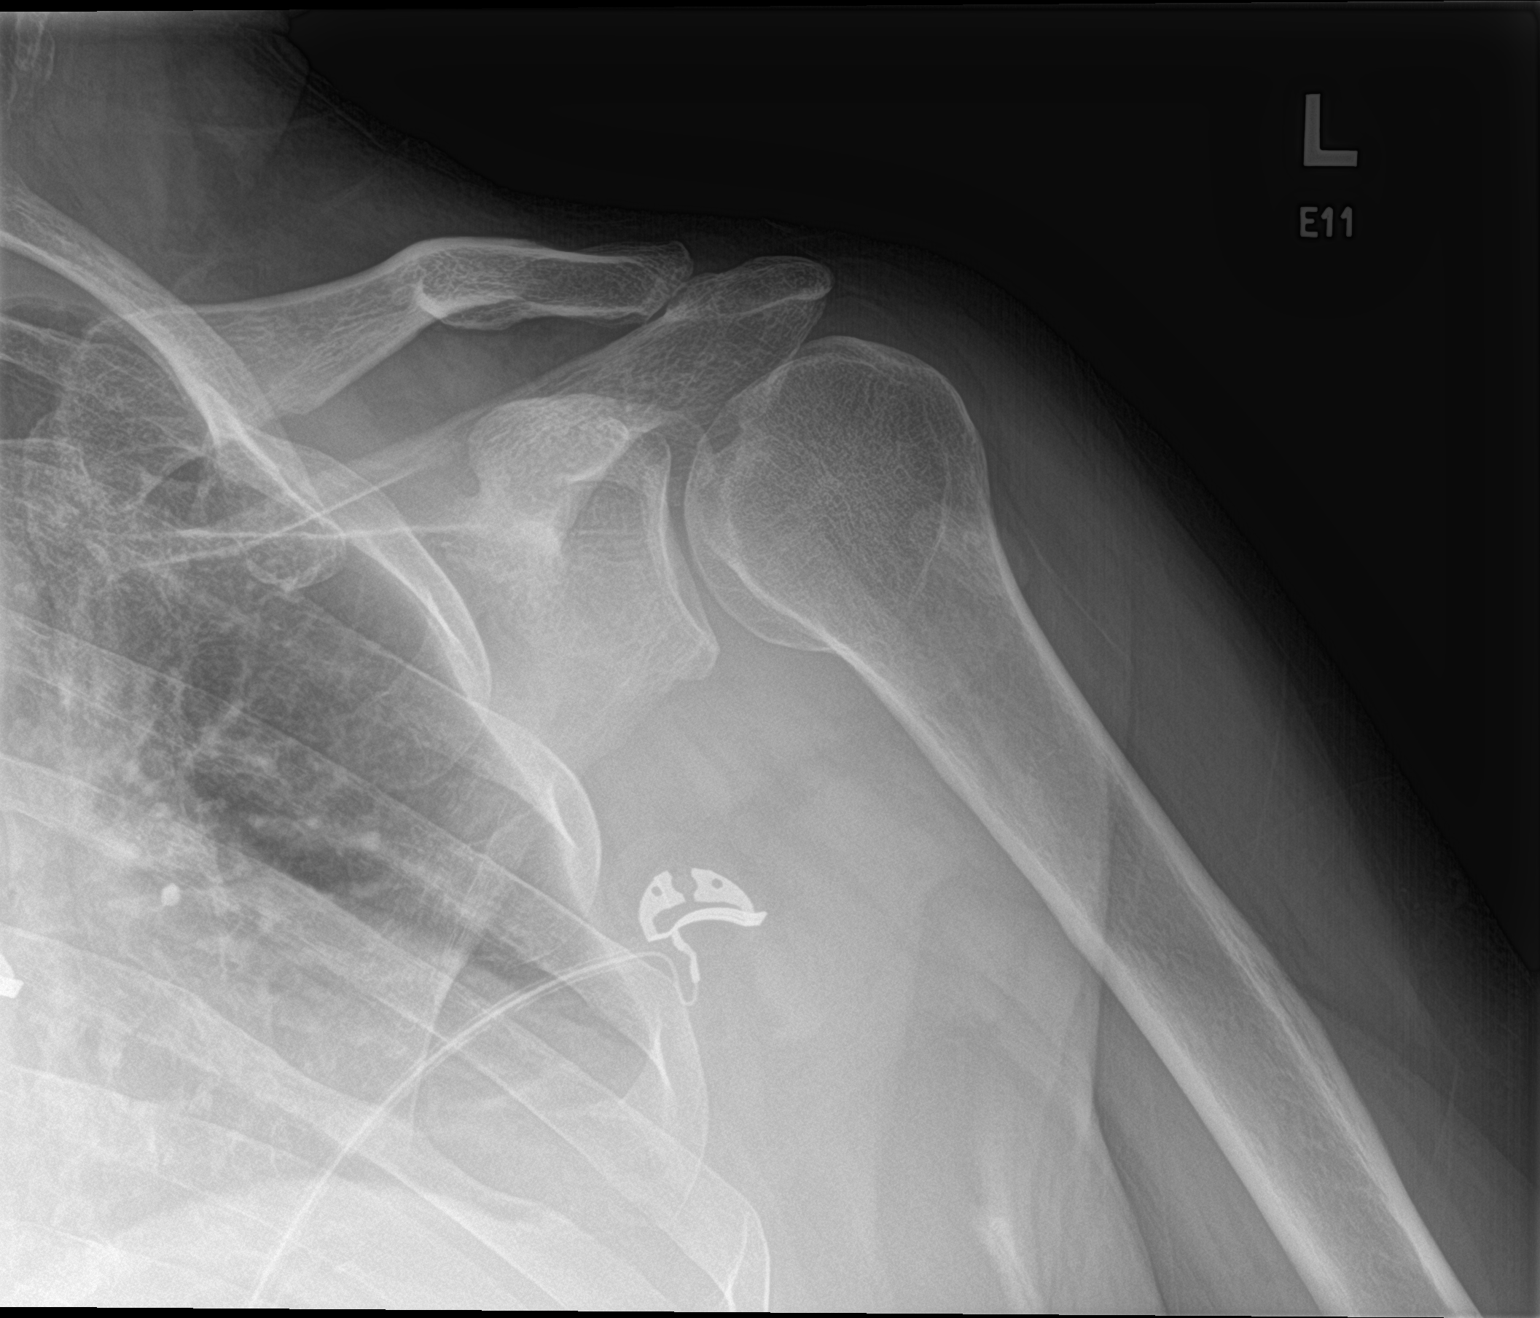

[shoulder axillary]
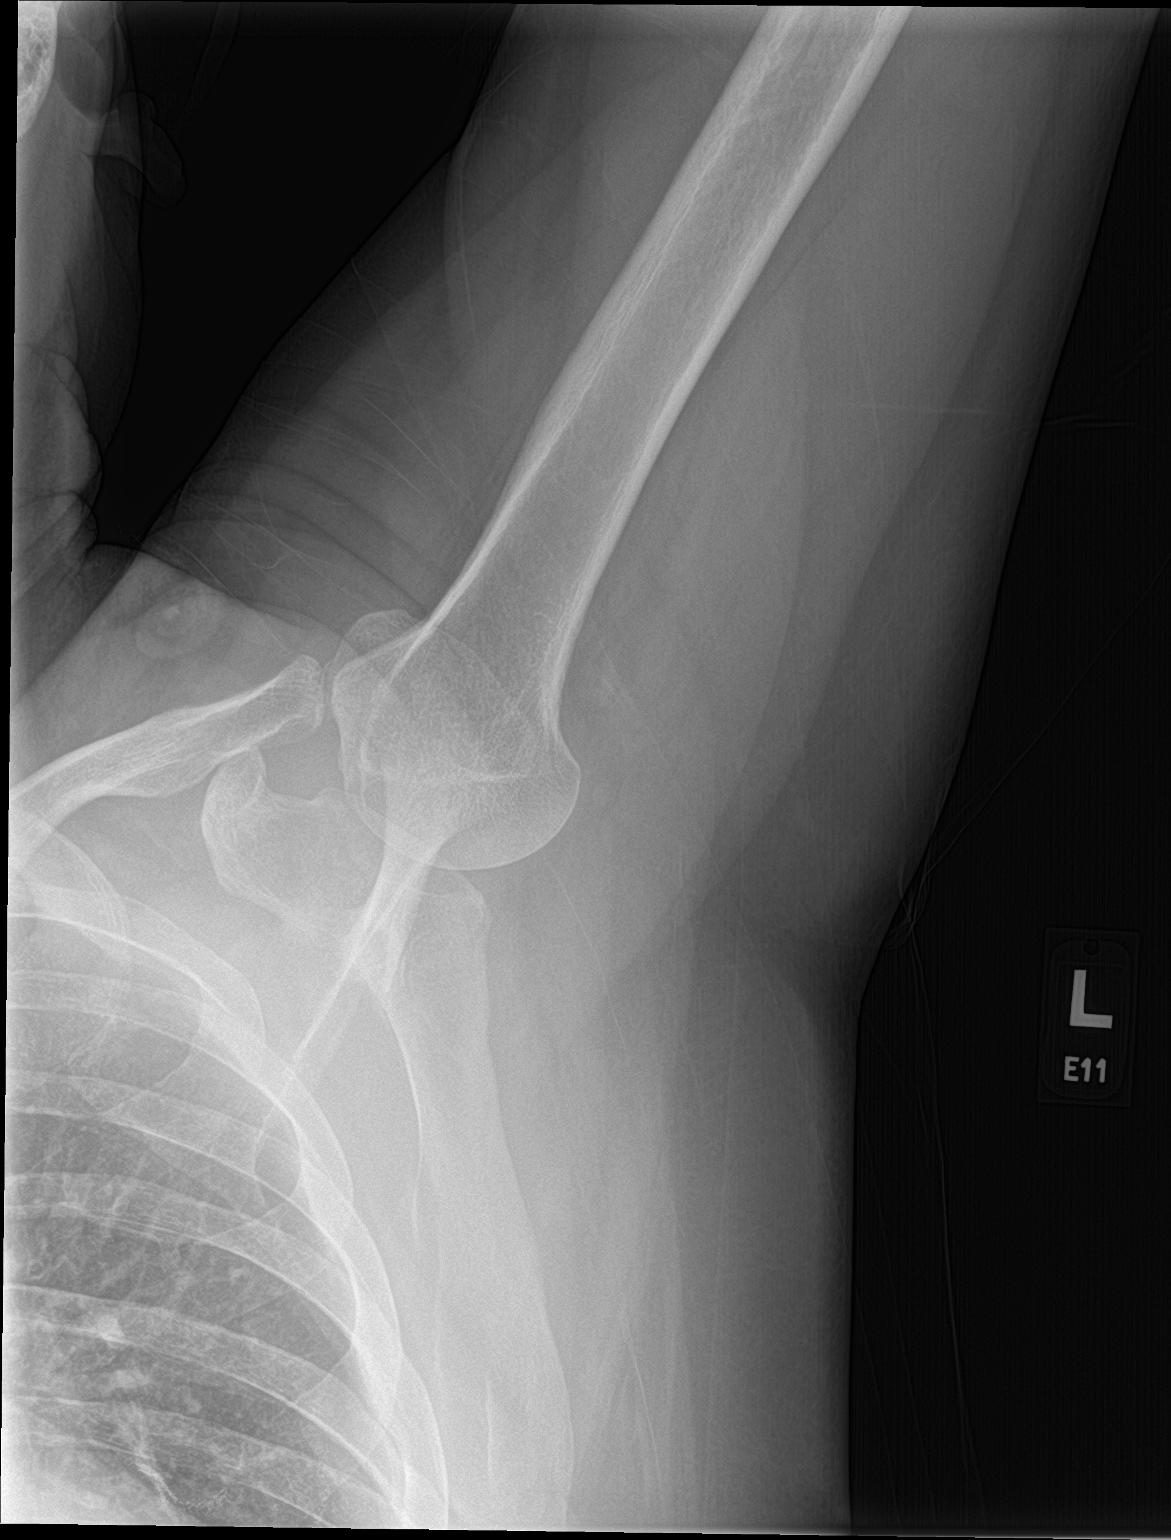

[shoulder y view]
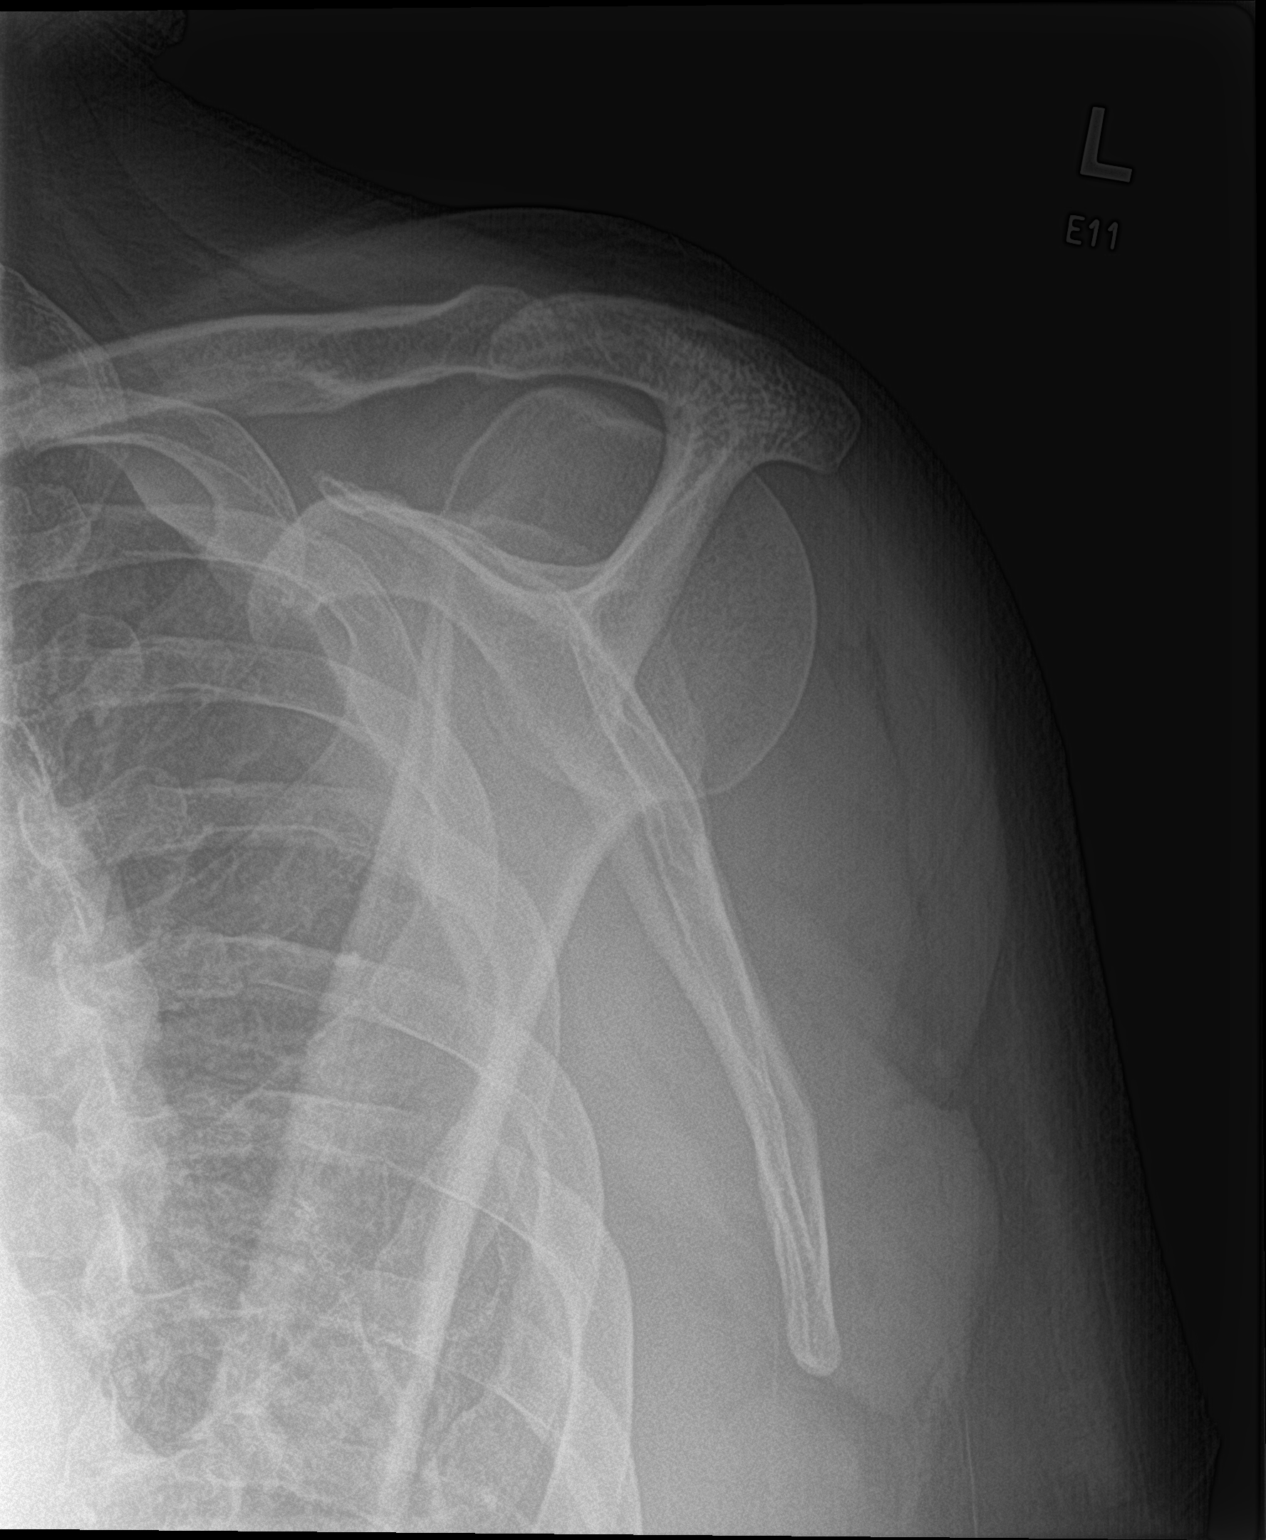

[3 of 3 positions shown; findings below may reference images not displayed]

FINDINGS: Mild AC joint degenerative change.  No fracture or malalignment.
IMPRESSION: No acute osseous abnormality.

## 2021-07-08 ENCOUNTER — Other Ambulatory Visit: Payer: Self-pay | Admitting: Cardiology

## 2021-07-09 ENCOUNTER — Other Ambulatory Visit: Payer: Self-pay

## 2021-07-09 MED ORDER — FLECAINIDE ACETATE 50 MG PO TABS: 50.0000 mg | ORAL_TABLET | Freq: Two times a day (BID) | ORAL | 2 refills | Status: DC

## 2021-07-22 ENCOUNTER — Other Ambulatory Visit: Payer: Self-pay | Admitting: Gastroenterology

## 2021-09-28 ENCOUNTER — Other Ambulatory Visit: Payer: Self-pay | Admitting: Oncology

## 2021-09-28 DIAGNOSIS — R918 Other nonspecific abnormal finding of lung field: Secondary | ICD-10-CM

## 2021-09-28 DIAGNOSIS — Z85528 Personal history of other malignant neoplasm of kidney: Secondary | ICD-10-CM

## 2021-10-01 NOTE — Assessment & Plan Note (Addendum)
History of stage I kidney cancer diagnosed in November 1999 He was treated with left nephrectomy.  He remains without evidence of recurrence. We will plan to see him back in 1 year with a CBC and comprehensive metabolic panel for repeat clinical assessment.

## 2021-10-01 NOTE — Progress Notes (Signed)
[name]  [address] [place],  [place]  [id number] [phone]  Clinic Day:  10/02/2021  Referring physician: No ref. provider found  ASSESSMENT & PLAN:   Assessment & Plan: Personal history of malignant neoplasm of kidney History of stage I kidney cancer diagnosed in November 1999 He was treated with left nephrectomy.  He remains without evidence of recurrence. We will plan to see him back in 1 year with a CBC and comprehensive metabolic panel for repeat clinical assessment.  Pulmonary nodules Recent CT chest did not reveal any persistent pulmonary nodules.  He is a non-smoker.  Unless he develops respiratory symptoms, I do not feel he needs routine imaging.  Erectile dysfunction The patient reports difficulty with erection.  I will add a PSA and testosterone today.  I advised him he would need to see a primary care provider or urologist for management of low testosterone and/or erectile dysfunction.    The patient understands the plans discussed today and is in agreement with them.  He knows to contact our office if he develops concerns prior to his next appointment.     Marvia Pickles, PA-C  [facility] [address] [place] [address] Dept: [phone] Dept Fax: [phone]   Orders Placed This Encounter  Procedures   CBC with Differential ([facility] Only)    Standing Status:   Future    Standing Expiration Date:   10/02/2022   CMP ([facility] only)    Standing Status:   Future    Standing Expiration Date:   10/02/2022   Prostate-Specific AG, Serum    Standing Status:   Future    Number of Occurrences:   1    Standing Expiration Date:   10/02/2022   Testosterone    Standing Status:   Future    Number of Occurrences:   1    Standing Expiration Date:   10/02/2022       CHIEF COMPLAINT:  CC: History of stage I kidney cancer  Current Treatment:   Observation   HISTORY OF PRESENT ILLNESS:  Alexander Travis is a 55 year old male with a history of stage I (T1a N0 M0) renal cell carcinoma diagnosed in November 1999. He was treated with left nephrectomy.  Pathology revealed a 6.1 cm renal cell carcinoma.  He has never had evidence of recurrence.  He has had transient lung nodules on CT scan, but underwent resection of a nodule, which was found to be benign.  In 2015, he developed hematuria, so underwent a CT chest, abdomen and pelvis, which did not reveal any evidence for recurrent, metastatic, or new malignancy.  He was seen by Dr. Comer Locket and underwent cystoscopy, which was apparently negative.  He does not have a primary care provider, but is followed by his cardiologist.  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  Dr. Bettina Gavia has done blood work, as well as an EKG in the past, which were apparently normal.  X-ray imaging in May 2021 at [facility] in [facility] revealed stable elevated left hemidiaphragm with possible left basilar atelectasis. He underwent cholecystectomy in July 2021.  INTERVAL HISTORY:  Alexander Travis is here today for repeat clinical assessment and states he is doing well. His only complaint is erectile dysfunction.  He denies urinary symptoms or hematuria.  He still does not have a primary care provider. He no longer sees a  urologist. He denies fevers or chills. He denies pain. His appetite is good, but he is trying to lose weight. His weight has decreased 9 pounds over last year .  He states he is up-to-date on colonoscopy.  Prior to his visit today, he underwent CT chest to reevaluate pulmonary nodules.  He is a non-smoker.  REVIEW OF SYSTEMS:  Review of Systems  Constitutional:  Negative for appetite change, chills, fatigue, fever and unexpected weight change.  HENT:   Negative for lump/mass, mouth sores and sore throat.   Respiratory:  Negative for cough and shortness of breath.    Cardiovascular:  Negative for chest pain and leg swelling.  Gastrointestinal:  Negative for abdominal pain, constipation, diarrhea, nausea and vomiting.  Genitourinary:  Negative for difficulty urinating, dysuria, frequency and hematuria.   Musculoskeletal:  Negative for arthralgias, back pain and myalgias.  Skin:  Negative for itching, rash and wound.  Neurological:  Negative for dizziness, extremity weakness, headaches, light-headedness and numbness.  Hematological:  Negative for adenopathy.  Psychiatric/Behavioral:  Negative for depression and sleep disturbance. The patient is not nervous/anxious.     VITALS:  Blood pressure 130/82, pulse 60, temperature 97.8 F (36.6 C), temperature source Oral, resp. rate 20, height 6\' 2"  (1.88 m), weight 277 lb 6.4 oz (125.8 kg), SpO2 97 %.  Wt Readings from Last 3 Encounters:  10/02/21 277 lb 6.4 oz (125.8 kg)  04/09/21 286 lb 12.8 oz (130.1 kg)  01/08/21 279 lb (126.6 kg)    Body mass index is 35.62 kg/m.  Performance status (ECOG): 0 - Asymptomatic  PHYSICAL EXAM:  Physical Exam Vitals and nursing note reviewed.  Constitutional:      General: He is not in acute distress.    Appearance: Normal appearance. He is normal weight.  HENT:     Head: Normocephalic and atraumatic.     Mouth/Throat:     Mouth: Mucous membranes are moist.     Pharynx: Oropharynx is clear. No oropharyngeal exudate or posterior oropharyngeal erythema.  Eyes:     General: No scleral icterus.    Extraocular Movements: Extraocular movements intact.     Conjunctiva/sclera: Conjunctivae normal.     Pupils: Pupils are equal, round, and reactive to light.  Cardiovascular:     Rate and Rhythm: Normal rate and regular rhythm.     Heart sounds: Normal heart sounds. No murmur heard.   No friction rub. No gallop.  Pulmonary:     Effort: Pulmonary effort is normal.     Breath sounds: Normal breath sounds. No wheezing, rhonchi or rales.  Abdominal:     General: Bowel  sounds are normal. There is no distension.     Palpations: Abdomen is soft. There is no hepatomegaly, splenomegaly or mass.     Tenderness: There is no abdominal tenderness.  Musculoskeletal:        General: Normal range of motion.     Cervical back: Normal range of motion and neck supple. No tenderness.     Right lower leg: No edema.     Left lower leg: No edema.  Lymphadenopathy:     Cervical: No cervical adenopathy.     Upper Body:     Right upper body: No supraclavicular or axillary adenopathy.     Left upper body: No supraclavicular or axillary adenopathy.     Lower Body: No right inguinal adenopathy. No left inguinal adenopathy.  Skin:    General: Skin is warm and dry.     Coloration: Skin  is not jaundiced.     Findings: No rash.  Neurological:     Mental Status: He is alert and oriented to person, place, and time.     Cranial Nerves: No cranial nerve deficit.  Psychiatric:        Mood and Affect: Mood normal.        Behavior: Behavior normal.        Thought Content: Thought content normal.    LABS:   CBC Latest Ref Rng & Units 10/02/2021 01/08/2021 10/02/2020  WBC - 7.8 8.7 8.9  Hemoglobin 13.5 - 17.5 16.4 16.3 16.3  Hematocrit 41 - 53 49 48.4 50.4  Platelets 150 - 399 231 252 276   CMP Latest Ref Rng & Units 10/02/2021 01/08/2021 10/02/2020  Glucose 70 - 99 mg/dL - 94 106(H)  BUN 4 - 21 16 15 15   Creatinine 0.6 - 1.3 1.1 1.09 1.19  Sodium 137 - 147 140 137 139  Potassium 3.4 - 5.3 4.4 4.1 4.4  Chloride 99 - 108 106 102 104  CO2 13 - 22 25(A) 25 25  Calcium 8.7 - 10.7 8.9 9.5 9.4  Total Protein 6.5 - 8.1 g/dL - - 7.9  Total Bilirubin 0.3 - 1.2 mg/dL - - 0.8  Alkaline Phos 25 - 125 108 - 90  AST 14 - 40 25 - 18  ALT 10 - 40 32 - 25     No results found for: CEA1 / No results found for: CEA1 No results found for: PSA1 No results found for: URK270 No results found for: CAN125  No results found for: TOTALPROTELP, ALBUMINELP, A1GS, A2GS, BETS, BETA2SER, GAMS,  MSPIKE, SPEI No results found for: TIBC, FERRITIN, IRONPCTSAT No results found for: LDH  STUDIES:  No results found.    Exam(s): [id number] CT/CT CHEST W/O CM  CLINICAL DATA: History of left renal cancer status post nephrectomy  with lung nodules.  EXAM:  CT CHEST WITHOUT CONTRAST  TECHNIQUE:  Multidetector CT imaging of the chest was performed following the  standard protocol without IV contrast.  COMPARISON: CT March 14, 2014  FINDINGS:  Cardiovascular: Aortic atherosclerosis without aneurysmal dilation.  Normal caliber central pulmonary arteries. Normal size heart. No  significant pericardial effusion/thickening.  Mediastinum/Nodes: No discrete thyroid nodule. No pathologically  enlarged mediastinal, hilar or axillary lymph nodes, noting limited  sensitivity for the detection of hilar adenopathy on this  noncontrast study.. Calcified subcarinal lymph node. The trachea and  esophagus are grossly unremarkable.  Lungs/Pleura: Similar scarring in the left lower lobe and lingula.  No suspicious pulmonary nodules or masses. Right upper lobe  calcified granuloma. No new focal airspace consolidation.  Upper Abdomen: No acute abnormality.  Musculoskeletal: Benign osseous hemangioma in the T12 vertebral  body. No acute osseous abnormality.  IMPRESSION:  1. Stable scarring in the left lung base. No suspicious pulmonary  nodules or masses.  2. Aortic Atherosclerosis (ICD10-I70.0).   HISTORY:   Past Medical History:  Diagnosis Date   Cancer ([place]) 1999   renal cell in left kidney   Erectile dysfunction 10/02/2021   GERD (gastroesophageal reflux disease)    History of kidney cancer    dx 09/1998   Palpitation 11/19/2015   Overview:  Seen in ED 11/11/15 without arrhythmia, CBC and CMP were normal   PSVT (paroxysmal supraventricular tachycardia) ([place])    Pulmonary nodules 10/02/2021   Sleep apnea    uses cpap    Past Surgical History:  Procedure Laterality Date    CHOLECYSTECTOMY  06/09/2020   ESOPHAGOGASTRODUODENOSCOPY  10/23/2003   Minimal transiet hiatal hernia. Otherwise normal esophagogastroduodenoscopy    KIDNEY SURGERY     KNEE ARTHROSCOPY WITH MEDIAL MENISECTOMY Right 05/30/2018   Procedure: RIGHT KNEE ARTHROSCOPY WITH MEDIAL MENISECTOMY;  Surgeon: Garald Balding, MD;  Location: [facility];  Service: Orthopedics;  Laterality: Right;   lung biospy Left    x2   WRIST SURGERY Right     Family History  Problem Relation Age of Onset   Heart disease Mother    Heart disease Father    Colon cancer Neg Hx    Esophageal cancer Neg Hx     Social History:  reports that he has never smoked. He has never used smokeless tobacco. He reports that he does not drink alcohol and does not use drugs.The patient is alone today.  Allergies:  Allergies  Allergen Reactions   Codeine Other (See Comments)    Hematuria    Current Medications: Current Outpatient Medications  Medication Sig Dispense Refill   flecainide (TAMBOCOR) 50 MG tablet Take 1 tablet (50 mg total) by mouth 2 (two) times daily. 180 tablet 2   metoprolol tartrate (LOPRESSOR) 25 MG tablet TAKE 1 TABLET BY MOUTH TWICE A DAY 180 tablet 2   pantoprazole (PROTONIX) 20 MG tablet TAKE 1 TABLET BY MOUTH EVERY DAY 90 tablet 0   No current facility-administered medications for this visit.

## 2021-10-02 ENCOUNTER — Inpatient Hospital Stay: Payer: BC Managed Care – PPO | Attending: Hematology and Oncology | Admitting: Hematology and Oncology

## 2021-10-02 ENCOUNTER — Other Ambulatory Visit: Payer: Self-pay

## 2021-10-02 ENCOUNTER — Encounter: Payer: Self-pay | Admitting: Hematology and Oncology

## 2021-10-02 ENCOUNTER — Inpatient Hospital Stay: Payer: BC Managed Care – PPO

## 2021-10-02 VITALS — BP 130/82 | HR 60 | Temp 97.8°F | Resp 20 | Ht 74.0 in | Wt 277.4 lb

## 2021-10-02 DIAGNOSIS — C642 Malignant neoplasm of left kidney, except renal pelvis: Secondary | ICD-10-CM

## 2021-10-02 DIAGNOSIS — Z9049 Acquired absence of other specified parts of digestive tract: Secondary | ICD-10-CM | POA: Insufficient documentation

## 2021-10-02 DIAGNOSIS — Z85528 Personal history of other malignant neoplasm of kidney: Secondary | ICD-10-CM | POA: Insufficient documentation

## 2021-10-02 DIAGNOSIS — Z905 Acquired absence of kidney: Secondary | ICD-10-CM | POA: Insufficient documentation

## 2021-10-02 DIAGNOSIS — Z79899 Other long term (current) drug therapy: Secondary | ICD-10-CM | POA: Diagnosis not present

## 2021-10-02 DIAGNOSIS — N528 Other male erectile dysfunction: Secondary | ICD-10-CM | POA: Diagnosis not present

## 2021-10-02 DIAGNOSIS — Z8249 Family history of ischemic heart disease and other diseases of the circulatory system: Secondary | ICD-10-CM | POA: Diagnosis not present

## 2021-10-02 DIAGNOSIS — J984 Other disorders of lung: Secondary | ICD-10-CM | POA: Insufficient documentation

## 2021-10-02 DIAGNOSIS — R918 Other nonspecific abnormal finding of lung field: Secondary | ICD-10-CM

## 2021-10-02 DIAGNOSIS — I700 Atherosclerosis of aorta: Secondary | ICD-10-CM | POA: Diagnosis not present

## 2021-10-02 DIAGNOSIS — Z885 Allergy status to narcotic agent status: Secondary | ICD-10-CM | POA: Insufficient documentation

## 2021-10-02 DIAGNOSIS — N529 Male erectile dysfunction, unspecified: Secondary | ICD-10-CM

## 2021-10-02 HISTORY — DX: Other nonspecific abnormal finding of lung field: R91.8

## 2021-10-02 HISTORY — DX: Male erectile dysfunction, unspecified: N52.9

## 2021-10-02 LAB — BASIC METABOLIC PANEL
BUN: 16 (ref 4–21)
CO2: 25 — AB (ref 13–22)
Chloride: 106 (ref 99–108)
Creatinine: 1.1 (ref 0.6–1.3)
Glucose: 114
Potassium: 4.4 (ref 3.4–5.3)
Sodium: 140 (ref 137–147)

## 2021-10-02 LAB — COMPREHENSIVE METABOLIC PANEL
Albumin: 4.2 (ref 3.5–5.0)
Calcium: 8.9 (ref 8.7–10.7)

## 2021-10-02 LAB — CBC AND DIFFERENTIAL
HCT: 49 (ref 41–53)
Hemoglobin: 16.4 (ref 13.5–17.5)
Neutrophils Absolute: 5.07
Platelets: 231 (ref 150–399)
WBC: 7.8

## 2021-10-02 LAB — HEPATIC FUNCTION PANEL
ALT: 32 (ref 10–40)
AST: 25 (ref 14–40)
Alkaline Phosphatase: 108 (ref 25–125)
Bilirubin, Total: 0.6

## 2021-10-02 LAB — CBC: RBC: 5.37 — AB (ref 3.87–5.11)

## 2021-10-02 NOTE — Assessment & Plan Note (Signed)
The patient reports difficulty with erection.  I will add a PSA and testosterone today.  I advised him he would need to see a primary care provider or urologist for management of low testosterone and/or erectile dysfunction.

## 2021-10-02 NOTE — Assessment & Plan Note (Addendum)
Recent CT chest did not reveal any persistent pulmonary nodules.  He is a non-smoker.  Unless he develops respiratory symptoms, I do not feel he needs routine imaging.

## 2021-10-03 LAB — PROSTATE-SPECIFIC AG, SERUM (LABCORP): Prostate Specific Ag, Serum: 1.7 ng/mL (ref 0.0–4.0)

## 2021-10-03 LAB — TESTOSTERONE: Testosterone: 304 ng/dL (ref 264–916)

## 2021-10-05 ENCOUNTER — Telehealth: Payer: Self-pay

## 2021-10-05 NOTE — Telephone Encounter (Signed)
-----   Message from Marvia Pickles, PA-C sent at 10/05/2021  8:28 AM EST ----- Please let him know his PSA and testosterone were normal. Thanks!

## 2021-10-05 NOTE — Telephone Encounter (Signed)
Message left normal labs. 

## 2021-10-22 NOTE — Progress Notes (Signed)
Cardiology Office Note:    Date:  10/23/2021   ID:  Alexander Travis, DOB [birth year], MRN [id number]  PCP:  Patient, No Pcp Per (Inactive)  Cardiologist:  Shirlee More, MD    Referring MD: No ref. provider found    ASSESSMENT:    1. Drug-induced erectile dysfunction   2. PSVT (paroxysmal supraventricular tachycardia) ([facility])   3. High risk medication use   4. Atherosclerosis of aorta ([facility])    PLAN:    In order of problems listed above:  I suspect beta-blocker is part of the problem will discontinue switch to calcium channel blocker flecainide if unimproved needs to see a urologist Stable continue low-dose flecainide calcium channel blocker with Consider lipid-lowering therapy in the future, would have another drug at this time in view of his erectile dysfunction   Next appointment: 6 months   Medication Adjustments/Labs and Tests Ordered: Current medicines are reviewed at length with the patient today.  Concerns regarding medicines are outlined above.  Orders Placed This Encounter  Procedures   EKG 12-Lead   Meds ordered this encounter  Medications   diltiazem (CARDIZEM CD) 180 MG 24 hr capsule    Sig: Take 1 capsule (180 mg total) by mouth daily.    Dispense:  90 capsule    Refill:  3   Chief complaint: I am getting married and I worried about erectile dysfunction History of Present Illness:    Alexander Travis is a 55 y.o. male with a hx of  paroxysmal SVT and symptomatic PVCs on flecainide  last seen 04/09/2021.  He is followed by oncology for renal cell cancer first diagnosed in November 1999 treated with left nephrectomy and has never had clinical recurrence.  Oncology ordered a chest CT done at [facility] 09/30/2021 showing lung scarring and aortic atherosclerosis.  Compliance with diet, lifestyle and medications: Yes  From my perspective is here to follow-up on his SVT on flecainide therapy and finding of atherosclerosis on CT of his aorta. His concern is erectile  dysfunction is getting married and would like an alternative to the beta-blocker Doing well with his SVT no severely bothersome episodes no side effects from flecainide. We discussed lipid-lowering therapy but I think he has a side effect of the medication with erectile dysfunction and I am very hesitant to give him another medication at this time Transition beta-blocker to calcium channel blocker and if unimproved I think he should see a urologist. We can talk about lipid-lowering therapy in the future  Component Ref Range & Units 1 yr ago 2 yr ago 3 yr ago  Flecainide 0.20 - 1.00 ug/mL 0.18 Low   0.31 CM  0.20 CM    Past Medical History:  Diagnosis Date   Cancer ([place]) 1999   renal cell in left kidney   Erectile dysfunction 10/02/2021   GERD (gastroesophageal reflux disease)    History of kidney cancer    dx 09/1998   Palpitation 11/19/2015   Overview:  Seen in ED 11/11/15 without arrhythmia, CBC and CMP were normal   PSVT (paroxysmal supraventricular tachycardia) ([facility])    Pulmonary nodules 10/02/2021   Sleep apnea    uses cpap    Past Surgical History:  Procedure Laterality Date   CHOLECYSTECTOMY  06/09/2020   ESOPHAGOGASTRODUODENOSCOPY  10/23/2003   Minimal transiet hiatal hernia. Otherwise normal esophagogastroduodenoscopy    KIDNEY SURGERY     KNEE ARTHROSCOPY WITH MEDIAL MENISECTOMY Right 05/30/2018   Procedure: RIGHT KNEE ARTHROSCOPY WITH MEDIAL MENISECTOMY;  Surgeon: Durward Fortes,  Vonna Kotyk, MD;  Location: [facility];  Service: Orthopedics;  Laterality: Right;   lung biospy Left    x2   WRIST SURGERY Right     Current Medications: Current Meds  Medication Sig   diltiazem (CARDIZEM CD) 180 MG 24 hr capsule Take 1 capsule (180 mg total) by mouth daily.   flecainide (TAMBOCOR) 50 MG tablet Take 1 tablet (50 mg total) by mouth 2 (two) times daily.   pantoprazole (PROTONIX) 20 MG tablet TAKE 1 TABLET BY MOUTH EVERY DAY   [DISCONTINUED] metoprolol tartrate (LOPRESSOR) 25 MG tablet  TAKE 1 TABLET BY MOUTH TWICE A DAY     Allergies:   Codeine   Social History   Socioeconomic History   Marital status: Single    Spouse name: Not on file   Number of children: Not on file   Years of education: Not on file   Highest education level: Not on file  Occupational History   Not on file  Tobacco Use   Smoking status: Never    Passive exposure: Never   Smokeless tobacco: Never  Vaping Use   Vaping Use: Never used  Substance and Sexual Activity   Alcohol use: No   Drug use: No   Sexual activity: Not on file  Other Topics Concern   Not on file  Social History Narrative   Not on file   Social Determinants of Health   Financial Resource Strain: Not on file  Food Insecurity: Not on file  Transportation Needs: Not on file  Physical Activity: Not on file  Stress: Not on file  Social Connections: Not on file     Family History: The patient's family history includes Heart disease in his father and mother. There is no history of Colon cancer or Esophageal cancer. ROS:   Please see the history of present illness.    All other systems reviewed and are negative.  EKGs/Labs/Other Studies Reviewed:    The following studies were reviewed today:  EKG:  EKG ordered today and personally reviewed.  The ekg ordered today demonstrates sinus rhythm normal no findings of C antiarrhythmic drug toxicity QRS duration 84 ms  Recent Labs: 01/08/2021: B Natriuretic Peptide 26.4 10/02/2021: ALT 32; BUN 16; Creatinine 1.1; Hemoglobin 16.4; Platelets 231; Potassium 4.4; Sodium 140  Recent Lipid Panel    Component Value Date/Time   CHOL 181 03/19/2019 1456   TRIG 145 03/19/2019 1456   HDL 39 (L) 03/19/2019 1456   CHOLHDL 4.6 03/19/2019 1456   LDLCALC 113 (H) 03/19/2019 1456    Physical Exam:    VS:  BP 116/72   Pulse 70   Ht 6' (1.829 m)   Wt 276 lb 9.6 oz (125.5 kg)   SpO2 97%   BMI 37.51 kg/m     Wt Readings from Last 3 Encounters:  10/23/21 276 lb 9.6 oz (125.5  kg)  10/02/21 277 lb 6.4 oz (125.8 kg)  04/09/21 286 lb 12.8 oz (130.1 kg)     GEN:  Well nourished, well developed in no acute distress HEENT: Normal NECK: No JVD; No carotid bruits LYMPHATICS: No lymphadenopathy CARDIAC: RRR, no murmurs, rubs, gallops RESPIRATORY:  Clear to auscultation without rales, wheezing or rhonchi  ABDOMEN: Soft, non-tender, non-distended MUSCULOSKELETAL:  No edema; No deformity  SKIN: Warm and dry NEUROLOGIC:  Alert and oriented x 3 PSYCHIATRIC:  Normal affect    Signed, Shirlee More, MD  10/23/2021 9:45 AM    [place]

## 2021-10-23 ENCOUNTER — Encounter: Payer: Self-pay | Admitting: Cardiology

## 2021-10-23 ENCOUNTER — Ambulatory Visit (INDEPENDENT_AMBULATORY_CARE_PROVIDER_SITE_OTHER): Payer: BC Managed Care – PPO | Admitting: Cardiology

## 2021-10-23 ENCOUNTER — Other Ambulatory Visit: Payer: Self-pay

## 2021-10-23 VITALS — BP 116/72 | HR 70 | Ht 72.0 in | Wt 276.6 lb

## 2021-10-23 DIAGNOSIS — Z79899 Other long term (current) drug therapy: Secondary | ICD-10-CM | POA: Diagnosis not present

## 2021-10-23 DIAGNOSIS — N522 Drug-induced erectile dysfunction: Secondary | ICD-10-CM | POA: Diagnosis not present

## 2021-10-23 DIAGNOSIS — I471 Supraventricular tachycardia: Secondary | ICD-10-CM | POA: Diagnosis not present

## 2021-10-23 DIAGNOSIS — I700 Atherosclerosis of aorta: Secondary | ICD-10-CM | POA: Diagnosis not present

## 2021-10-23 MED ORDER — DILTIAZEM HCL ER COATED BEADS 180 MG PO CP24: 180.0000 mg | ORAL_CAPSULE | Freq: Every day | ORAL | 3 refills | Status: DC

## 2021-10-23 NOTE — Patient Instructions (Signed)
Medication Instructions:  Your physician has recommended you make the following change in your medication:  STOP: Metoprolol  START: Cardizem CD 180 mg take one tablet by mouth daily.  *If you need a refill on your cardiac medications before your next appointment, please call your pharmacy*   Lab Work: None If you have labs (blood work) drawn today and your tests are completely normal, you will receive your results only by: [place] (if you have MyChart) OR A paper copy in the mail If you have any lab test that is abnormal or we need to change your treatment, we will call you to review the results.   Testing/Procedures: None   Follow-Up: At [facility], you and your health needs are our priority.  As part of our continuing mission to provide you with exceptional heart care, we have created designated Provider Care Teams.  These Care Teams include your primary Cardiologist (physician) and Advanced Practice Providers (APPs -  Physician Assistants and Nurse Practitioners) who all work together to provide you with the care you need, when you need it.  We recommend signing up for the patient portal called "MyChart".  Sign up information is provided on this After Visit Summary.  MyChart is used to connect with patients for Virtual Visits (Telemedicine).  Patients are able to view lab/test results, encounter notes, upcoming appointments, etc.  Non-urgent messages can be sent to your provider as well.   To learn more about what you can do with MyChart, go to [email or web address].    Your next appointment:   1 year(s)  The format for your next appointment:   In Person  Provider:   Shirlee More, MD    Other Instructions

## 2021-11-06 ENCOUNTER — Ambulatory Visit: Payer: BC Managed Care – PPO | Admitting: Cardiology

## 2021-11-12 ENCOUNTER — Encounter: Payer: Self-pay | Admitting: Cardiology

## 2022-01-20 ENCOUNTER — Telehealth: Payer: Self-pay | Admitting: Cardiology

## 2022-01-20 MED ORDER — DILTIAZEM HCL ER COATED BEADS 180 MG PO CP24: 180.0000 mg | ORAL_CAPSULE | Freq: Every day | ORAL | 2 refills | Status: DC

## 2022-01-20 NOTE — Telephone Encounter (Signed)
?*  STAT* If patient is at the pharmacy, call can be transferred to refill team. ? ? ?1. Which medications need to be refilled? (please list name of each medication and dose if known)  ?diltiazem (CARDIZEM CD) 180 MG 24 hr capsule ? ?2. Which pharmacy/location (including street and city if local pharmacy) is medication to be sent to? [id number]- [place], [place] - [address] ? ?3. Do they need a 30 day or 90 day supply? 90 day ? ? ?Patient goes out of town tomorrow and will not have enough medication ?

## 2022-01-20 NOTE — Telephone Encounter (Signed)
Refill of Diltiazem 180 mg sent to [facility] [place]. ?

## 2022-04-02 ENCOUNTER — Other Ambulatory Visit: Payer: Self-pay | Admitting: Cardiology

## 2022-08-02 ENCOUNTER — Encounter (HOSPITAL_BASED_OUTPATIENT_CLINIC_OR_DEPARTMENT_OTHER): Payer: Self-pay | Admitting: Emergency Medicine

## 2022-08-02 ENCOUNTER — Emergency Department (HOSPITAL_BASED_OUTPATIENT_CLINIC_OR_DEPARTMENT_OTHER)
Admission: EM | Admit: 2022-08-02 | Discharge: 2022-08-02 | Disposition: A | Discharge status: Home / Self Care | Payer: BC Managed Care – PPO | Attending: Emergency Medicine | Admitting: Emergency Medicine

## 2022-08-02 ENCOUNTER — Other Ambulatory Visit: Payer: Self-pay

## 2022-08-02 ENCOUNTER — Emergency Department (HOSPITAL_BASED_OUTPATIENT_CLINIC_OR_DEPARTMENT_OTHER): Payer: BC Managed Care – PPO

## 2022-08-02 DIAGNOSIS — S8292XA Unspecified fracture of left lower leg, initial encounter for closed fracture: Secondary | ICD-10-CM | POA: Insufficient documentation

## 2022-08-02 DIAGNOSIS — M25572 Pain in left ankle and joints of left foot: Secondary | ICD-10-CM | POA: Insufficient documentation

## 2022-08-02 DIAGNOSIS — S99912A Unspecified injury of left ankle, initial encounter: Secondary | ICD-10-CM | POA: Diagnosis present

## 2022-08-02 DIAGNOSIS — S82892A Other fracture of left lower leg, initial encounter for closed fracture: Secondary | ICD-10-CM

## 2022-08-02 DIAGNOSIS — W19XXXA Unspecified fall, initial encounter: Secondary | ICD-10-CM | POA: Diagnosis not present

## 2022-08-02 DIAGNOSIS — Y9301 Activity, walking, marching and hiking: Secondary | ICD-10-CM | POA: Insufficient documentation

## 2022-08-02 DIAGNOSIS — Y990 Civilian activity done for income or pay: Secondary | ICD-10-CM | POA: Diagnosis not present

## 2022-08-02 NOTE — ED Provider Notes (Signed)
[facility] EMERGENCY DEPARTMENT Provider Note   CSN: [id number] Arrival date & time: 08/02/22  1750     History  Chief Complaint  Patient presents with   Ankle Pain    Alexander Travis is a 56 y.o. male.  Who presents ED for evaluation of left ankle pain.  He reports that approximately 2 PM today he was walking at work and his left ankle inverted.  He did not step off a curb or walk on any uneven ground.  He reports instant sharp pain to the dorsum of the midfoot.  Denies numbness, weakness or tingling in the area.  Denies pallor or cold sensation.  Patient states he was able to ambulate after the injury but with significant pain.  Presents to the ED today with crutches from home.   Ankle Pain      Home Medications Prior to Admission medications   Medication Sig Start Date End Date Taking? Authorizing Provider  diltiazem (CARDIZEM CD) 180 MG 24 hr capsule Take 1 capsule (180 mg total) by mouth daily. 01/20/22 04/20/22  Richardo Priest, MD  flecainide (TAMBOCOR) 50 MG tablet TAKE 1 TABLET BY MOUTH TWICE A DAY 04/02/22   Richardo Priest, MD  pantoprazole (PROTONIX) 20 MG tablet TAKE 1 TABLET BY MOUTH EVERY DAY 07/23/21   Jackquline Denmark, MD      Allergies    Codeine    Review of Systems   Review of Systems  Musculoskeletal:  Positive for arthralgias.  All other systems reviewed and are negative.   Physical Exam Updated Vital Signs BP (!) 144/92 (BP Location: Left Arm)   Pulse 88   Temp 98.8 F (37.1 C) (Oral)   Resp 18   Ht '6\' 2"'$  (1.88 m)   Wt 98.4 kg   SpO2 96%   BMI 27.86 kg/m  Physical Exam Vitals and nursing note reviewed.  Constitutional:      General: He is not in acute distress.    Appearance: Normal appearance. He is normal weight. He is not ill-appearing.  HENT:     Head: Normocephalic and atraumatic.  Pulmonary:     Effort: Pulmonary effort is normal. No respiratory distress.  Abdominal:     General: Abdomen is flat.  Musculoskeletal:         General: Normal range of motion.     Cervical back: Neck supple.     Right ankle: Normal.     Left ankle: Swelling present. No ecchymosis. No tenderness. Normal range of motion. Anterior drawer test negative. Normal pulse.     Left Achilles Tendon: Normal. No tenderness.     Right foot: Normal.     Left foot: Normal range of motion and normal capillary refill. Swelling and bony tenderness (Lateral aspect of the dorsum of the foot) present. Normal pulse.     Comments: Patient does have an area of bruising to the dorsum of the foot.  Neurovascular status intact.  Pulses normal.  Skin:    General: Skin is warm and dry.  Neurological:     Mental Status: He is alert and oriented to person, place, and time.  Psychiatric:        Mood and Affect: Mood normal.        Behavior: Behavior normal.     ED Results / Procedures / Treatments   Labs (all labs ordered are listed, but only abnormal results are displayed) Labs Reviewed - No data to display  EKG None  Radiology DG Ankle Complete  Left  Result Date: 08/02/2022 CLINICAL DATA:  Trauma, pain EXAM: LEFT ANKLE COMPLETE - 3+ VIEW COMPARISON:  None Available. FINDINGS: There are faint linear calcifications in the soft tissues lateral to the tarsals. There is marked soft tissue swelling over the lateral malleolus. Rest of the bony structures are essentially unremarkable. There are faint arterial calcifications in soft tissues. IMPRESSION: Linear calcifications noted along the lateral margin of 1 of the tarsals in the lateral aspect of left ankle suggests recent or old avulsion. If clinically warranted, routine radiographs of left foot may be considered for further characterization. Electronically Signed   By: Elmer Picker M.D.   On: 08/02/2022 18:30    Procedures Procedures    Medications Ordered in ED Medications - No data to display  ED Course/ Medical Decision Making/ A&P Clinical Course as of 08/02/22 2140  Mon Aug 02, 2022  2126  DG Foot Complete Left I personally reviewed the image.  Avulsion fractures to the calcaneus and lateral malleolus. [AS]    Clinical Course User Index [AS] Yittel Emrich, Grafton Folk, PA-C                           Medical Decision Making Amount and/or Complexity of Data Reviewed Radiology: ordered.  This patient presents to the ED for concern of left ankle pain. the differential diagnosis includes fracture, ankle sprain or strain  Additional history obtained from: Nursing notes from this visit.  I ordered imaging studies including x-ray left ankle and x-ray of left foot I independently visualized and interpreted imaging which showed lucencies suggesting possible avulsion with recommendation to perform repeat imaging for further evaluation.  Left foot showed avulsion fractures of the calcaneus and the lateral malleolus I agree with the radiologist interpretation  Afebrile, hemodynamically stable.  X-ray does show avulsion fractures of the left ankle.  Will place patient in a posterior short leg splint.  Advised patient to be nonweightbearing.  Informed patient to follow-up with orthopedics, advised him to call call them to schedule an appointment tomorrow.  Informed patient of return precautions.  Advised him that he should elevate the extremity and he may ice through the cast.  He may alternate Tylenol and ibuprofen at home for pain. He presented with crutches and will use those. Stable at discharge.  At this time there does not appear to be any evidence of an acute emergency medical condition and the patient appears stable for discharge with appropriate outpatient follow up. Diagnosis was discussed with patient who verbalizes understanding of care plan and is agreeable to discharge. I have discussed return precautions with patient who verbalizes understanding. Patient encouraged to follow-up with their PCP within 1 week. All questions answered.  Patient's case discussed with Dr. Kathrynn Humble who agrees  with plan to discharge with follow-up.   Note: Portions of this report may have been transcribed using voice recognition software. Every effort was made to ensure accuracy; however, inadvertent computerized transcription errors may still be present.          Final Clinical Impression(s) / ED Diagnoses Final diagnoses:  None    Rx / DC Orders ED Discharge Orders     None         Roylene Reason, Hershal Coria 08/02/22 2146    Varney Biles, MD 08/03/22 [phone]

## 2022-08-02 NOTE — ED Triage Notes (Signed)
Patient presents to ED via POV from home. Here after "stepping wrong" and injuring his left ankle. Edema noted. Ambulatory on arrival with crutches.

## 2022-08-02 NOTE — ED Notes (Signed)
Waiting for foot DG to place cam boot per PA Schutt

## 2022-08-02 NOTE — Discharge Instructions (Addendum)
You have been seen today for your complaint of left ankle pain. Your imaging showed avulsion fractures of your left ankle. Home care instructions are as follows:  You should not put weight on the left foot.  You should elevate the extremity above the level of the heart when resting.  You may take Tylenol and ibuprofen at home for pain.  You may alternate these every 4 hours. You should follow dosing instructions on the bottle. You may ice the area through the cast.  Follow up with: Orthopedics.  You should call the phone number listed in this packet to schedule an appointment for an ED follow-up.  You should call them tomorrow. Please seek immediate medical care if you develop any of the following symptoms: You have severe pain that lasts. You develop new pain or swelling. Your skin or toenails below the injury turn blue or gray, feel cold, become numb, or are less sensitive to the touch. At this time there does not appear to be the presence of an emergent medical condition, however there is always the potential for conditions to change. Please read and follow the below instructions.  Do not take your medicine if  develop an itchy rash, swelling in your mouth or lips, or difficulty breathing; call 911 and seek immediate emergency medical attention if this occurs.  You may review your lab tests and imaging results in their entirety on your MyChart account.  Please discuss all results of fully with your primary care provider and other specialist at your follow-up visit.  Note: Portions of this text may have been transcribed using voice recognition software. Every effort was made to ensure accuracy; however, inadvertent computerized transcription errors may still be present.

## 2022-10-06 ENCOUNTER — Other Ambulatory Visit: Payer: Self-pay | Admitting: Gastroenterology

## 2022-10-13 ENCOUNTER — Other Ambulatory Visit: Payer: Self-pay | Admitting: Gastroenterology

## 2022-10-21 ENCOUNTER — Encounter: Payer: Self-pay | Admitting: Cardiology

## 2022-10-21 ENCOUNTER — Ambulatory Visit: Payer: BC Managed Care – PPO | Attending: Cardiology | Admitting: Cardiology

## 2022-10-21 VITALS — BP 128/80 | HR 77 | Ht 74.0 in | Wt 284.0 lb

## 2022-10-21 DIAGNOSIS — I471 Supraventricular tachycardia, unspecified: Secondary | ICD-10-CM

## 2022-10-21 DIAGNOSIS — I493 Ventricular premature depolarization: Secondary | ICD-10-CM | POA: Diagnosis not present

## 2022-10-21 DIAGNOSIS — Z79899 Other long term (current) drug therapy: Secondary | ICD-10-CM

## 2022-10-21 DIAGNOSIS — N522 Drug-induced erectile dysfunction: Secondary | ICD-10-CM | POA: Diagnosis not present

## 2022-10-21 NOTE — Patient Instructions (Signed)
Medication Instructions:  Your physician recommends that you continue on your current medications as directed. Please refer to the Current Medication list given to you today.  *If you need a refill on your cardiac medications before your next appointment, please call your pharmacy*   Lab Work: None If you have labs (blood work) drawn today and your tests are completely normal, you will receive your results only by: [facility] (if you have MyChart) OR A paper copy in the mail If you have any lab test that is abnormal or we need to change your treatment, we will call you to review the results.   Testing/Procedures: None   Follow-Up: At [facility], you and your health needs are our priority.  As part of our continuing mission to provide you with exceptional heart care, we have created designated Provider Care Teams.  These Care Teams include your primary Cardiologist (physician) and Advanced Practice Providers (APPs -  Physician Assistants and Nurse Practitioners) who all work together to provide you with the care you need, when you need it.  We recommend signing up for the patient portal called "MyChart".  Sign up information is provided on this After Visit Summary.  MyChart is used to connect with patients for Virtual Visits (Telemedicine).  Patients are able to view lab/test results, encounter notes, upcoming appointments, etc.  Non-urgent messages can be sent to your provider as well.   To learn more about what you can do with MyChart, go to [email or web address].    Your next appointment:   1 year(s)  The format for your next appointment:   In Person  Provider:   Shirlee More, MD    Other Instructions None  Important Information About Sugar

## 2022-10-21 NOTE — Progress Notes (Signed)
Cardiology Office Note:    Date:  10/21/2022   ID:  Alexander Travis, DOB 12/17/1965, MRN [id number]  PCP:  Patient, No Pcp Per  Cardiologist:  Shirlee More, MD    Referring MD: No ref. provider found    ASSESSMENT:    1. PSVT (paroxysmal supraventricular tachycardia)   2. PVC (premature ventricular contraction)   3. High risk medication use   4. Drug-induced erectile dysfunction    PLAN:    In order of problems listed above:  He continues to do well little or no arrhythmia and a paucity of antiarrhythmic drug flecainide once daily there are no risk of toxicity along with his Cardizem. Continue flecainide Improved erectile function   Next appointment: 1 year   Medication Adjustments/Labs and Tests Ordered: Current medicines are reviewed at length with the patient today.  Concerns regarding medicines are outlined above.  No orders of the defined types were placed in this encounter.  No orders of the defined types were placed in this encounter.   Chief Complaint  Patient presents with   Follow-up   SVT    History of Present Illness:    Alexander Travis is a 56 y.o. male with a hx of paroxysmal SVT and symptomatic PVCs suppressed with flecainide renal cell cancer followed by oncology with left nephrectomy and erectile dysfunction last seen 10/23/2021. Compliance with diet, lifestyle and medications: Yes  He has done quite well with flecainide little or no arrhythmia and has reduced flecainide to once daily I think this is a great approach Direct dysfunction has resolved No side effects of flecainide no edema shortness of breath chest pain palpitations syncope Past Medical History:  Diagnosis Date   Cancer ([facility]) 1999   renal cell in left kidney   Erectile dysfunction 10/02/2021   GERD (gastroesophageal reflux disease)    History of kidney cancer    dx 09/1998   Palpitation 11/19/2015   Overview:  Seen in ED 11/11/15 without arrhythmia, CBC and CMP were normal   PSVT  (paroxysmal supraventricular tachycardia)    Pulmonary nodules 10/02/2021   Sleep apnea    uses cpap    Past Surgical History:  Procedure Laterality Date   CHOLECYSTECTOMY  06/09/2020   ESOPHAGOGASTRODUODENOSCOPY  10/23/2003   Minimal transiet hiatal hernia. Otherwise normal esophagogastroduodenoscopy    KIDNEY SURGERY     KNEE ARTHROSCOPY WITH MEDIAL MENISECTOMY Right 05/30/2018   Procedure: RIGHT KNEE ARTHROSCOPY WITH MEDIAL MENISECTOMY;  Surgeon: Garald Balding, MD;  Location: [facility];  Service: Orthopedics;  Laterality: Right;   lung biospy Left    x2   WRIST SURGERY Right     Current Medications: Current Meds  Medication Sig   diltiazem (CARDIZEM CD) 180 MG 24 hr capsule Take 1 capsule (180 mg total) by mouth daily.   flecainide (TAMBOCOR) 50 MG tablet TAKE 1 TABLET BY MOUTH TWICE A DAY   pantoprazole (PROTONIX) 20 MG tablet TAKE 1 TABLET BY MOUTH EVERY DAY     Allergies:   Codeine   Social History   Socioeconomic History   Marital status: Single    Spouse name: Not on file   Number of children: Not on file   Years of education: Not on file   Highest education level: Not on file  Occupational History   Not on file  Tobacco Use   Smoking status: Never    Passive exposure: Never   Smokeless tobacco: Never  Vaping Use   Vaping Use: Never used  Substance and  Sexual Activity   Alcohol use: No   Drug use: No   Sexual activity: Not on file  Other Topics Concern   Not on file  Social History Narrative   Not on file   Social Determinants of Health   Financial Resource Strain: Not on file  Food Insecurity: Not on file  Transportation Needs: Not on file  Physical Activity: Not on file  Stress: Not on file  Social Connections: Not on file     Family History: The patient's family history includes Heart disease in his father and mother. There is no history of Colon cancer or Esophageal cancer. ROS:   Please see the history of present illness.    All other  systems reviewed and are negative.  EKGs/Labs/Other Studies Reviewed:    The following studies were reviewed today:  EKG:  EKG ordered today and personally reviewed.  The ekg ordered today demonstrates sinus rhythm left anterior hemiblock otherwise normal EKG  Recent Labs: 10/02/2021 hemoglobin 16.4 creatinine 1.10 potassium 4.4 Recent Lipid Panel    Component Value Date/Time   CHOL 181 03/19/2019 1456   TRIG 145 03/19/2019 1456   HDL 39 (L) 03/19/2019 1456   CHOLHDL 4.6 03/19/2019 1456   LDLCALC 113 (H) 03/19/2019 1456    Physical Exam:    VS:  BP 128/80 (BP Location: Right Arm, Patient Position: Sitting, Cuff Size: Large)   Pulse 77   Ht '6\' 2"'$  (1.88 m)   Wt 284 lb (128.8 kg)   SpO2 97%   BMI 36.46 kg/m     Wt Readings from Last 3 Encounters:  10/21/22 284 lb (128.8 kg)  08/02/22 217 lb (98.4 kg)  10/23/21 276 lb 9.6 oz (125.5 kg)     GEN:  Well nourished, well developed in no acute distress HEENT: Normal NECK: No JVD; No carotid bruits LYMPHATICS: No lymphadenopathy CARDIAC: RRR, no murmurs, rubs, gallops RESPIRATORY:  Clear to auscultation without rales, wheezing or rhonchi  ABDOMEN: Soft, non-tender, non-distended MUSCULOSKELETAL:  No edema; No deformity  SKIN: Warm and dry NEUROLOGIC:  Alert and oriented x 3 PSYCHIATRIC:  Normal affect    Signed, Shirlee More, MD  10/21/2022 2:40 PM    [facility]

## 2022-12-31 ENCOUNTER — Other Ambulatory Visit: Payer: Self-pay | Admitting: Cardiology

## 2022-12-31 NOTE — Telephone Encounter (Signed)
Refill sent to pharmacy.   

## 2023-04-01 ENCOUNTER — Other Ambulatory Visit: Payer: Self-pay | Admitting: Cardiology

## 2023-04-01 NOTE — Telephone Encounter (Signed)
Refills to pharmacy 

## 2023-08-09 NOTE — Progress Notes (Unsigned)
Cardiology Office Note:    Date:  08/10/2023   ID:  Alexander Travis, DOB Dec 06, 1965, MRN [id number]  PCP:  Patient, No Pcp Per  Cardiologist:  Norman Herrlich, MD    Referring MD: No ref. provider found    ASSESSMENT:    1. Chest pain of uncertain etiology   2. PSVT (paroxysmal supraventricular tachycardia)   3. PVC's (premature ventricular contractions)   4. High risk medication use   5. Precordial pain    PLAN:    In order of problems listed above:  Symptoms best described as atypical angina he will continue his calcium channel blocker discontinue over-the-counter NSAIDs I will have cardiac CTA done for diagnosis and to guide management. If he has significant CAD will need to stop his flecainide   Next appointment: 6 weeks   Medication Adjustments/Labs and Tests Ordered: Current medicines are reviewed at length with the patient today.  Concerns regarding medicines are outlined above.  Orders Placed This Encounter  Procedures   CT CORONARY MORPH W/CTA COR W/SCORE W/CA W/CM &/OR WO/CM   Basic Metabolic Panel (BMET)   EKG 12-Lead   Meds ordered this encounter  Medications   pantoprazole (PROTONIX) 40 MG tablet    Sig: Take 1 tablet (40 mg total) by mouth daily.    Dispense:  90 tablet    Refill:  3   metoprolol tartrate (LOPRESSOR) 100 MG tablet    Sig: Take 1 tablet (100 mg total) by mouth once for 1 dose. Please take this medication 2 hours before CT    Dispense:  1 tablet    Refill:  0     History of Present Illness:    Alexander Travis is a 57 y.o. male with a hx of SVT and symptomatic PVCs suppressed with flecainide renal cell cancer followed by nephrology with previous left nephrectomy and erectile dysfunction last seen 10/21/2022.  Previous chest CT showed aortic atherosclerosis but no coronary artery calcification November 2022. Compliance with diet, lifestyle and medications: Yes  For the last month he has had recurrent episodes of chest discomfort it is not  severe he calls it fullness almost with indigestion substernally not with physical activity lasts for few minutes and is relieved with rest. He not having any heartburn or indigestion but he has been taking anNSAIDfor back pain The symptoms are not exertional in nature but when he checks his blood pressures in the range of 190/70 during the episode on 1 occasion he was diaphoretic no radiation to the jaw shoulder or back not short of breath pleuritic and has had about 5 or 6 episodes in total Past Medical History:  Diagnosis Date   Cancer ([facility]) 1999   renal cell in left kidney   Erectile dysfunction 10/02/2021   GERD (gastroesophageal reflux disease)    History of kidney cancer    dx 09/1998   Palpitation 11/19/2015   Overview:  Seen in ED 11/11/15 without arrhythmia, CBC and CMP were normal   PSVT (paroxysmal supraventricular tachycardia)    Pulmonary nodules 10/02/2021   Sleep apnea    uses cpap    Current Medications: Current Meds  Medication Sig   diltiazem (CARDIZEM CD) 180 MG 24 hr capsule TAKE 1 CAPSULE BY MOUTH EVERY DAY   flecainide (TAMBOCOR) 50 MG tablet TAKE 1 TABLET BY MOUTH TWICE A DAY   metoprolol tartrate (LOPRESSOR) 100 MG tablet Take 1 tablet (100 mg total) by mouth once for 1 dose. Please take this medication 2 hours before CT  pantoprazole (PROTONIX) 40 MG tablet Take 1 tablet (40 mg total) by mouth daily.   [DISCONTINUED] pantoprazole (PROTONIX) 20 MG tablet TAKE 1 TABLET BY MOUTH EVERY DAY      EKGs/Labs/Other Studies Reviewed:    The following studies were reviewed today:  EKG Interpretation Date/Time:  Wednesday August 10 2023 10:03:28 EDT Ventricular Rate:  73 PR Interval:  148 QRS Duration:  86 QT Interval:  396 QTC Calculation: 436 R Axis:   -46  Text Interpretation: Normal sinus rhythm ABNORMAL R WAVE PROGRESSION Left anterior fascicular block Minimal voltage criteria for LVH, may be normal variant ( R in aVL ) When compared with ECG of  08-Jan-2021 14:48, No significant change was found Confirmed by Norman Herrlich ([id number]) on 08/10/2023 10:20:21 AM   Recent Labs: No results found for requested labs within last 365 days.  Recent Lipid Panel    Component Value Date/Time   CHOL 181 03/19/2019 1456   TRIG 145 03/19/2019 1456   HDL 39 (L) 03/19/2019 1456   CHOLHDL 4.6 03/19/2019 1456   LDLCALC 113 (H) 03/19/2019 1456    Physical Exam:    VS:  BP 124/60 (BP Location: Right Arm, Patient Position: Sitting, Cuff Size: Normal)   Pulse 73   Ht 6\' 2"  (1.88 m)   Wt 282 lb (127.9 kg)   SpO2 95%   BMI 36.21 kg/m     Wt Readings from Last 3 Encounters:  08/10/23 282 lb (127.9 kg)  10/21/22 284 lb (128.8 kg)  08/02/22 217 lb (98.4 kg)     GEN:  Well nourished, well developed in no acute distress HEENT: Normal NECK: No JVD; No carotid bruits LYMPHATICS: No lymphadenopathy CARDIAC: RRR, no murmurs, rubs, gallops RESPIRATORY:  Clear to auscultation without rales, wheezing or rhonchi  ABDOMEN: Soft, non-tender, non-distended MUSCULOSKELETAL:  No edema; No deformity  SKIN: Warm and dry NEUROLOGIC:  Alert and oriented x 3 PSYCHIATRIC:  Normal affect    Signed, Norman Herrlich, MD  08/10/2023 11:12 AM    [facility]

## 2023-08-10 ENCOUNTER — Ambulatory Visit: Payer: BC Managed Care – PPO | Attending: Cardiology | Admitting: Cardiology

## 2023-08-10 ENCOUNTER — Encounter: Payer: Self-pay | Admitting: Cardiology

## 2023-08-10 VITALS — BP 124/60 | HR 73 | Ht 74.0 in | Wt 282.0 lb

## 2023-08-10 DIAGNOSIS — I493 Ventricular premature depolarization: Secondary | ICD-10-CM | POA: Diagnosis not present

## 2023-08-10 DIAGNOSIS — Z79899 Other long term (current) drug therapy: Secondary | ICD-10-CM

## 2023-08-10 DIAGNOSIS — R079 Chest pain, unspecified: Secondary | ICD-10-CM

## 2023-08-10 DIAGNOSIS — R072 Precordial pain: Secondary | ICD-10-CM

## 2023-08-10 DIAGNOSIS — I471 Supraventricular tachycardia, unspecified: Secondary | ICD-10-CM

## 2023-08-10 MED ORDER — PANTOPRAZOLE SODIUM 40 MG PO TBEC: 40.0000 mg | DELAYED_RELEASE_TABLET | Freq: Every day | ORAL | 3 refills | Status: DC

## 2023-08-10 MED ORDER — METOPROLOL TARTRATE 100 MG PO TABS: 100.0000 mg | ORAL_TABLET | Freq: Once | ORAL | 0 refills | Status: DC

## 2023-08-10 NOTE — Patient Instructions (Addendum)
Medication Instructions: Your physician has recommended you make the following change in your medication:   START:Protonix 40 mg daily  *If you need a refill on your cardiac medications before your next appointment, please call your pharmacy*   Lab Work: Your physician recommends that you return for lab work in:   Labs today: BMP  If you have labs (blood work) drawn today and your tests are completely normal, you will receive your results only by: MyChart Message (if you have MyChart) OR A paper copy in the mail If you have any lab test that is abnormal or we need to change your treatment, we will call you to review the results.   Testing/Procedures:   Please follow these instructions carefully (unless otherwise directed):  An IV will be required for this test and Nitroglycerin will be given.  Hold all erectile dysfunction medications at least 3 days (72 hrs) prior to test. (Ie viagra, cialis, sildenafil, tadalafil, etc)   On the Night Before the Test: Be sure to Drink plenty of water. Do not consume any caffeinated/decaffeinated beverages or chocolate 12 hours prior to your test. Do not take any antihistamines 12 hours prior to your test.  On the Day of the Test: Drink plenty of water until 1 hour prior to the test. Do not eat any food 1 hour prior to test. You may take your regular medications prior to the test.  Take metoprolol (Lopressor) two hours prior to test.        After the Test: Drink plenty of water. After receiving IV contrast, you may experience a mild flushed feeling. This is normal. On occasion, you may experience a mild rash up to 24 hours after the test. This is not dangerous. If this occurs, you can take Benadryl 25 mg and increase your fluid intake. If you experience trouble breathing, this can be serious. If it is severe call 911 IMMEDIATELY. If it is mild, please call our office.  We will call to schedule your test 2-4 weeks out understanding that  some insurance companies will need an authorization prior to the service being performed.     Follow-Up: At [facility], you and your health needs are our priority.  As part of our continuing mission to provide you with exceptional heart care, we have created designated Provider Care Teams.  These Care Teams include your primary Cardiologist (physician) and Advanced Practice Providers (APPs -  Physician Assistants and Nurse Practitioners) who all work together to provide you with the care you need, when you need it.  We recommend signing up for the patient portal called "MyChart".  Sign up information is provided on this After Visit Summary.  MyChart is used to connect with patients for Virtual Visits (Telemedicine).  Patients are able to view lab/test results, encounter notes, upcoming appointments, etc.  Non-urgent messages can be sent to your provider as well.   To learn more about what you can do with MyChart, go to [email or web address].    Your next appointment:   6 week(s)  Provider:   Norman Herrlich, MD    Other Instructions None

## 2023-08-11 LAB — BASIC METABOLIC PANEL
BUN/Creatinine Ratio: 13 (ref 9–20)
BUN: 13 mg/dL (ref 6–24)
CO2: 25 mmol/L (ref 20–29)
Calcium: 10.2 mg/dL (ref 8.7–10.2)
Chloride: 105 mmol/L (ref 96–106)
Creatinine, Ser: 1.04 mg/dL (ref 0.76–1.27)
Glucose: 107 mg/dL — ABNORMAL HIGH (ref 70–99)
Potassium: 4.9 mmol/L (ref 3.5–5.2)
Sodium: 141 mmol/L (ref 134–144)
eGFR: 83 mL/min/{1.73_m2} (ref >59)

## 2023-08-18 ENCOUNTER — Encounter (HOSPITAL_COMMUNITY): Payer: Self-pay

## 2023-08-22 ENCOUNTER — Ambulatory Visit (HOSPITAL_COMMUNITY)
Admission: RE | Admit: 2023-08-22 | Discharge: 2023-08-22 | Disposition: A | Discharge status: Home / Self Care | Payer: BC Managed Care – PPO | Source: Ambulatory Visit | Attending: Cardiology | Admitting: Cardiology

## 2023-08-22 DIAGNOSIS — R072 Precordial pain: Secondary | ICD-10-CM | POA: Diagnosis present

## 2023-08-22 MED ORDER — IOHEXOL 350 MG/ML SOLN
95.0000 mL | Freq: Once | INTRAVENOUS | Status: AC | PRN
  Administered 2023-08-22: 95 mL via INTRAVENOUS

## 2023-08-22 MED ORDER — NITROGLYCERIN 0.4 MG SL SUBL
0.8000 mg | SUBLINGUAL_TABLET | Freq: Once | SUBLINGUAL | Status: AC
  Administered 2023-08-22: 0.8 mg via SUBLINGUAL

## 2023-08-22 MED ORDER — NITROGLYCERIN 0.4 MG SL SUBL
SUBLINGUAL_TABLET | SUBLINGUAL | Status: AC
  Filled 2023-08-22: qty 2

## 2023-08-23 ENCOUNTER — Other Ambulatory Visit: Payer: Self-pay

## 2023-08-23 MED ORDER — ROSUVASTATIN CALCIUM 10 MG PO TABS: 10.0000 mg | ORAL_TABLET | Freq: Every day | ORAL | 3 refills | Status: DC

## 2023-09-25 NOTE — Progress Notes (Unsigned)
Cardiology Office Note:    Date:  09/27/2023   ID:  Alexander Travis, DOB 1966-11-14, MRN [id number]  PCP:  Patient, No Pcp Per  Cardiologist:  Norman Herrlich, MD    Referring MD: No ref. provider found    ASSESSMENT:    1. PSVT (paroxysmal supraventricular tachycardia) (HCC)   2. PVC's (premature ventricular contractions)   3. High risk medication use   4. Mild CAD   5. Dyslipidemia    PLAN:    In order of problems listed above:  Continue low-dose flecainide and diltiazem good response both SVT and PVCs no sign of toxicity Stable mild nonobstructive CAD asked him to start aspirin and continue his high intensity statin Cialis for erectile dysfunction   Next appointment: 9 months   Medication Adjustments/Labs and Tests Ordered: Current medicines are reviewed at length with the patient today.  Concerns regarding medicines are outlined above.  Orders Placed This Encounter  Procedures   EKG 12-Lead   Meds ordered this encounter  Medications   aspirin EC 81 MG tablet    Sig: Take 1 tablet (81 mg total) by mouth daily. Swallow whole.    Dispense:  90 tablet    Refill:  3   tadalafil (CIALIS) 5 MG tablet    Sig: Take 1 tablet (5 mg total) by mouth daily as needed for erectile dysfunction.    Dispense:  10 tablet    Refill:  1     History of Present Illness:    Alexander Travis is a 57 y.o. male with a hx of SVT and symptomatic PVCs suppressed with flecainide and renal cell cancer with previous left nephrectomy followed by nephrology last seen 08/10/2023.  Following the visit he had a cardiac CTA reported 08/22/2019 for the calcium score of 23 33rd percentile for age and sex matched control and mild nonobstructive CAD 25 to 49% throughout the proximal and mid right coronary artery.  Compliance with diet, lifestyle and medications: Yes  Reviewed his cardiac CTA very mild CAD asymptomatic I think he can remain on flecainide He has no chest pain edema shortness of breath  palpitation or syncope I did ask him to start baby aspirin daily EKG shows no QRS prolongation or signs of group 1C antiarrhythmic drug toxicity He is engaged in a relationship we discussed Cialis and given a prescription Past Medical History:  Diagnosis Date   Cancer (HCC) 1999   renal cell in left kidney   Erectile dysfunction 10/02/2021   GERD (gastroesophageal reflux disease)    History of kidney cancer    dx 09/1998   Palpitation 11/19/2015   Overview:  Seen in ED 11/11/15 without arrhythmia, CBC and CMP were normal   Palpitations 11/19/2015   Overview:   Seen in ED 11/11/15 without arrhythmia, CBC and CMP were normal     He was seen at [facility] emergency room with palpitation 02/14/2019.  I am reviewing his records his rhythm was sinus the EKG showed left anterior hemiblock and was otherwise normal including his QT interval.  Chest x-ray was performed and showed left lung base scarring.  While in the emergency room he was in s   Personal history of malignant neoplasm of kidney    dx 09/1998     PSVT (paroxysmal supraventricular tachycardia) (HCC)    Pulmonary nodules 10/02/2021   Sleep apnea    uses cpap   Stasis dermatitis 10/11/2018    Current Medications: Current Meds  Medication Sig   aspirin EC 81  MG tablet Take 1 tablet (81 mg total) by mouth daily. Swallow whole.   diltiazem (CARDIZEM CD) 180 MG 24 hr capsule TAKE 1 CAPSULE BY MOUTH EVERY DAY   flecainide (TAMBOCOR) 50 MG tablet TAKE 1 TABLET BY MOUTH TWICE A DAY   pantoprazole (PROTONIX) 40 MG tablet Take 1 tablet (40 mg total) by mouth daily.   rosuvastatin (CRESTOR) 10 MG tablet Take 1 tablet (10 mg total) by mouth daily.   tadalafil (CIALIS) 5 MG tablet Take 1 tablet (5 mg total) by mouth daily as needed for erectile dysfunction.      EKGs/Labs/Other Studies Reviewed:    The following studies were reviewed today:  Cardiac Studies & Procedures     STRESS TESTS  MYOCARDIAL PERFUSION IMAGING  11/21/2019  Narrative  Nuclear stress EF: 69%.  There was no ST segment deviation noted during stress.  This is a low risk study.  The left ventricular ejection fraction is hyperdynamic (>65%).  No evidence of ischemia or scar.  Normal EF.     MONITORS  LONG TERM MONITOR (3-14 DAYS) 12/04/2019  Narrative A ZIO monitor was performed for 14 days beginning 12/04/2019 to assess for ventricular arrhythmia.  Rhythm throughout is sinus first-degree AV block with average minimum and maximum heart rates of 61, 47 and 92 bpm.  There were no pauses of 3 seconds or greater and no episodes of second or third-degree AV block or sinus node exit block.  Ventricular ectopy was frequent with a total burden of 5.9% couplets and triplets were rare less than 1%.  There is 1 4 beat run of ventricular premature contractions noted.  Another strip was most consistent with artifact lead attachment.  There wasno supraventricular ectopy noted.  There were 6 triggered and 2 diary events predominantly associated with frequent PVCs   Conclusion: Frequent PVCs with couplets triplets and 1 4 beat run of ventricular premature contractions.  The triggered and diary events are generally associated with frequent PVCs   CT SCANS  CT CORONARY MORPH W/CTA COR W/SCORE 08/22/2023  Addendum 09/13/2023  2:41 PM ADDENDUM REPORT: 09/13/2023 14:38  EXAM: OVER-READ INTERPRETATION  CT CHEST  The following report is an over-read performed by radiologist Dr. Curly Shores [facility] on 09/13/2023. This over-read does not include interpretation of cardiac or coronary anatomy or pathology. The coronary CTA interpretation by the cardiologist is attached.  COMPARISON:  09/30/2021.  FINDINGS: Cardiovascular:  See findings discussed in the body of the report.  Mediastinum/Nodes: No suspicious adenopathy identified. Imaged mediastinal structures are unremarkable.  Lungs/Pleura: Stable linear scarring  at the left base. Imaged lungs are otherwise clear. No pleural effusion or pneumothorax.  Upper Abdomen: No acute abnormality.  Musculoskeletal: There is mild bilateral gynecomastia. No acute osseous findings.  IMPRESSION: 1. Mild bilateral gynecomastia. 2. No acute noncardiac abnormality identified.   Electronically Signed By: Layla Maw M.D. On: 09/13/2023 14:38  Narrative CLINICAL DATA:  This is a 68 male with anginal symptoms.  EXAM: Cardiac/Coronary  CTA  TECHNIQUE: The patient was scanned on a [employer].  FINDINGS: A 100 kV prospective scan was triggered in the descending thoracic aorta at 111 HU's. Axial non-contrast 3 mm slices were carried out through the heart. The data set was analyzed on a dedicated work station and scored using the Agatson method. Gantry rotation speed was 250 msecs and collimation was .6 mm. No beta blockade and 0.8 mg of sl NTG was given. The 3D data set was reconstructed in 5%  intervals of the 67-82 % of the R-R cycle. Diastolic phases were analyzed on a dedicated work station using MPR, MIP and VRT modes. The patient received 80 cc of contrast.  Aorta: Normal size.  No calcifications.  No dissection.  Aortic Valve:  Trileaflet.  No calcifications.  Coronary Arteries:  Normal coronary origin.  Right dominance.  RCA is a large dominant artery that gives rise to PDA and PLA. There is minimal (<24%) calcification in the throughout the proximal and mid RCA. The distal RCA with no plaques.  Left main is a large artery that gives rise to LAD and LCX arteries.  LAD is a large vessel. Minimal focal mixed plaque in the proximal LAD. The mid and distal LAD with no plaques. D1 is a very small vessel with no plaques. D1 is a medium sized vessel with focal minimal soft plaque in the proximal portion of the vessel.  LCX is a non-dominant small artery that gives rise to one large OM1 branch. There is no plaque.  Coronary  Calcium Score:  Left main: 0  Left anterior descending artery: 0.96  Left circumflex artery: 0  Right coronary artery: 22.0  Total: 22.96  Percentile: 33  Other findings:  Normal pulmonary vein drainage into the left atrium.  Normal left atrial appendage without a thrombus.  Normal size of the pulmonary artery.  IMPRESSION: 1. Coronary calcium score of 22.96. This was 24 percentile for age and sex matched control.  2. Normal coronary origin with right dominance.  3. CAD-RADS 2. Mild non-obstructive CAD (25-49%). Consider non-atherosclerotic causes of chest pain. Consider preventive therapy and risk factor modification.  The noncardiac portion of this study will be interpreted in separate report by the radiologist.  Electronically Signed: By: Thomasene Ripple D.O. On: 08/22/2023 15:32          EKG Interpretation Date/Time:  Tuesday September 27 2023 08:47:58 EST Ventricular Rate:  71 PR Interval:  170 QRS Duration:  90 QT Interval:  394 QTC Calculation: 428 R Axis:   -47  Text Interpretation: Normal sinus rhythm Left anterior fascicular block Moderate voltage criteria for LVH, may be normal variant ( R in aVL , Cornell product ) When compared with ECG of 10-Aug-2023 10:03, Unchanged Confirmed by Norman Herrlich ([id number]) on 09/27/2023 8:57:43 AM   Recent Labs: 08/10/2023: BUN 13; Creatinine, Ser 1.04; Potassium 4.9; Sodium 141  Recent Lipid Panel    Component Value Date/Time   CHOL 181 03/19/2019 1456   TRIG 145 03/19/2019 1456   HDL 39 (L) 03/19/2019 1456   CHOLHDL 4.6 03/19/2019 1456   LDLCALC 113 (H) 03/19/2019 1456    Physical Exam:    VS:  BP (!) 153/81   Pulse 71   Ht 6\' 2"  (1.88 m)   Wt 281 lb 12.8 oz (127.8 kg)   SpO2 96%   BMI 36.18 kg/m     Wt Readings from Last 3 Encounters:  09/27/23 281 lb 12.8 oz (127.8 kg)  08/10/23 282 lb (127.9 kg)  10/21/22 284 lb (128.8 kg)     GEN:  Well nourished, well developed in no acute distress HEENT:  Normal NECK: No JVD; No carotid bruits LYMPHATICS: No lymphadenopathy CARDIAC: RRR, no murmurs, rubs, gallops RESPIRATORY:  Clear to auscultation without rales, wheezing or rhonchi  ABDOMEN: Soft, non-tender, non-distended MUSCULOSKELETAL:  No edema; No deformity  SKIN: Warm and dry NEUROLOGIC:  Alert and oriented x 3 PSYCHIATRIC:  Normal affect    Signed, Norman Herrlich, MD  09/27/2023  9:21 AM    [facility]

## 2023-09-26 ENCOUNTER — Other Ambulatory Visit: Payer: Self-pay

## 2023-09-26 DIAGNOSIS — Z85528 Personal history of other malignant neoplasm of kidney: Secondary | ICD-10-CM | POA: Insufficient documentation

## 2023-09-27 ENCOUNTER — Encounter: Payer: Self-pay | Admitting: Cardiology

## 2023-09-27 ENCOUNTER — Ambulatory Visit: Payer: BC Managed Care – PPO | Attending: Cardiology | Admitting: Cardiology

## 2023-09-27 VITALS — BP 153/81 | HR 71 | Ht 74.0 in | Wt 281.8 lb

## 2023-09-27 DIAGNOSIS — Z79899 Other long term (current) drug therapy: Secondary | ICD-10-CM | POA: Diagnosis not present

## 2023-09-27 DIAGNOSIS — I493 Ventricular premature depolarization: Secondary | ICD-10-CM | POA: Diagnosis not present

## 2023-09-27 DIAGNOSIS — I2510 Atherosclerotic heart disease of native coronary artery without angina pectoris: Secondary | ICD-10-CM | POA: Diagnosis not present

## 2023-09-27 DIAGNOSIS — E785 Hyperlipidemia, unspecified: Secondary | ICD-10-CM

## 2023-09-27 DIAGNOSIS — I471 Supraventricular tachycardia, unspecified: Secondary | ICD-10-CM

## 2023-09-27 MED ORDER — TADALAFIL 5 MG PO TABS: 5.0000 mg | ORAL_TABLET | Freq: Every day | ORAL | 1 refills | Status: AC | PRN

## 2023-09-27 MED ORDER — ASPIRIN 81 MG PO TBEC: 81.0000 mg | DELAYED_RELEASE_TABLET | Freq: Every day | ORAL | 3 refills | Status: AC

## 2023-09-27 NOTE — Patient Instructions (Signed)
Medication Instructions:  Your physician has recommended you make the following change in your medication:   START: Aspirin 81 mg daily START: Cialis 5 mg daily as needed  *If you need a refill on your cardiac medications before your next appointment, please call your pharmacy*   Lab Work: None If you have labs (blood work) drawn today and your tests are completely normal, you will receive your results only by: MyChart Message (if you have MyChart) OR A paper copy in the mail If you have any lab test that is abnormal or we need to change your treatment, we will call you to review the results.   Testing/Procedures: None   Follow-Up: At [facility], you and your health needs are our priority.  As part of our continuing mission to provide you with exceptional heart care, we have created designated Provider Care Teams.  These Care Teams include your primary Cardiologist (physician) and Advanced Practice Providers (APPs -  Physician Assistants and Nurse Practitioners) who all work together to provide you with the care you need, when you need it.  We recommend signing up for the patient portal called "MyChart".  Sign up information is provided on this After Visit Summary.  MyChart is used to connect with patients for Virtual Visits (Telemedicine).  Patients are able to view lab/test results, encounter notes, upcoming appointments, etc.  Non-urgent messages can be sent to your provider as well.   To learn more about what you can do with MyChart, go to [email or web address].    Your next appointment:   9 month(s)  Provider:   Norman Herrlich, MD    Other Instructions None

## 2023-09-28 ENCOUNTER — Encounter: Payer: Self-pay | Admitting: Gastroenterology

## 2023-12-09 ENCOUNTER — Encounter: Payer: Self-pay | Admitting: Cardiology

## 2023-12-21 ENCOUNTER — Other Ambulatory Visit: Payer: Self-pay | Admitting: Cardiology

## 2024-01-29 NOTE — Progress Notes (Unsigned)
 Cardiology Office Note:  .   Date:  01/30/2024  ID:  Alexander Travis, DOB 1966-05-03, MRN [id number] PCP: Patient, No Pcp Per  [facility] Providers Cardiologist:  None    History of Present Illness: .   Alexander Travis is a 58 y.o. male with a past medical history of SVT, PVCs--on flecainide, nonobstructive CAD per coronary CTA, OSA, GERD, palpitations, history of malignant neoplasm of kidney.  09/13/2023 coronary CTA calcium score 22.96, 33rd percentile, mild nonobstructive CAD 12/04/2019 monitor average heart rate 61 bpm, VE was frequent at 5.9% 11/20/2019 Lexiscan normal, low risk study  Most recently was evaluated by Dr. Dulce Sellar on 09/27/2023, he was stable from a cardiac perspective, he was started on aspirin and given Cialis as needed for ED with plans to follow-up in 9 months.  He presents today with concerns of worsening PVCs.  He apparently had an upper respiratory illness approximately 6 weeks ago, was taking over-the-counter supplements for this and noticed that his PVCs increased in frequency, he self adjusted his flecainide dose to twice daily without any decrease in his PVCs.  He is asymptomatic, just notices them and is bothered by them.  Was previously on metoprolol however this was stopped secondary to ED and he was transitioned to Cardizem. He denies chest pain, palpitations, dyspnea, pnd, orthopnea, n, v, dizziness, syncope, edema, weight gain, or early satiety.   ROS: Review of Systems  Cardiovascular:  Positive for palpitations.  All other systems reviewed and are negative.    Studies Reviewed: Marland Kitchen   EKG Interpretation Date/Time:  Monday January 30 2024 11:06:23 EDT Ventricular Rate:  79 PR Interval:  156 QRS Duration:  90 QT Interval:  384 QTC Calculation: 440 R Axis:   -49  Text Interpretation: Sinus rhythm with occasional Premature ventricular complexes and Fusion complexes Left anterior fascicular block Minimal voltage criteria for LVH, may be normal variant ( R  in aVL ) When compared with ECG of 27-Sep-2023 08:47, Fusion complexes are now Present Premature ventricular complexes are now Present Confirmed by Wallis Bamberg [id number]) on 01/30/2024 11:09:42 AM     Risk Assessment/Calculations:             Physical Exam:   VS:  BP 126/78   Pulse 79   Ht 6\' 2"  (1.88 m)   Wt 285 lb 9.6 oz (129.5 kg)   SpO2 95%   BMI 36.67 kg/m    Wt Readings from Last 3 Encounters:  01/30/24 285 lb 9.6 oz (129.5 kg)  09/27/23 281 lb 12.8 oz (127.8 kg)  08/10/23 282 lb (127.9 kg)    GEN: Well nourished, well developed in no acute distress NECK: No JVD; No carotid bruits CARDIAC: RRR, no murmurs, rubs, gallops RESPIRATORY:  Clear to auscultation without rales, wheezing or rhonchi  ABDOMEN: Soft, non-tender, non-distended EXTREMITIES:  No edema; No deformity   ASSESSMENT AND PLAN: .   PVCs -VE burden was 5.9% most recent monitor, currently on flecainide 50 mg twice daily, QRS remains normal, PVCs present on EKG today.  Will increase his Cardizem to 240 mg daily.  Was previously on metoprolol however this was stopped secondary to ED.  He also does not have a PCP so he has not had any lab work completed in some time, will check magnesium, thyroid for any contributory causes.  CAD-mild nonobstructive per coronary CTA in October 2024. Stable with no anginal symptoms. No indication for ischemic evaluation.  Continue aspirin 81 mg daily.   Dyslipidemia-he does not have a  lipid panel on file for review, does not currently have a PCP, will check LFTs, fasting lipid panel.  Currently is on Crestor 10 mg daily although he was told that he should probably be on something other than the statin as his mother had a reaction to a statin in the past.  Will arrange for bempedoic acid 160 mg daily, if this is cost prohibitive we could try a different statin or Zetia.       Dispo: Increase diltiazem to 240 mg daily, labs per above, keep recall with Dr. Dulce Sellar.  Signed, Flossie Dibble, NP

## 2024-01-30 ENCOUNTER — Ambulatory Visit: Attending: Cardiology | Admitting: Cardiology

## 2024-01-30 ENCOUNTER — Encounter: Payer: Self-pay | Admitting: Cardiology

## 2024-01-30 VITALS — BP 126/78 | HR 79 | Ht 74.0 in | Wt 285.6 lb

## 2024-01-30 DIAGNOSIS — I493 Ventricular premature depolarization: Secondary | ICD-10-CM

## 2024-01-30 DIAGNOSIS — I471 Supraventricular tachycardia, unspecified: Secondary | ICD-10-CM | POA: Diagnosis not present

## 2024-01-30 DIAGNOSIS — Z79899 Other long term (current) drug therapy: Secondary | ICD-10-CM

## 2024-01-30 DIAGNOSIS — E785 Hyperlipidemia, unspecified: Secondary | ICD-10-CM | POA: Diagnosis not present

## 2024-01-30 MED ORDER — BEMPEDOIC ACID 180 MG PO TABS: 180.0000 mg | ORAL_TABLET | Freq: Every day | ORAL | 11 refills | Status: AC

## 2024-01-30 MED ORDER — DILTIAZEM HCL ER COATED BEADS 240 MG PO CP24: 240.0000 mg | ORAL_CAPSULE | Freq: Every day | ORAL | 3 refills | Status: AC

## 2024-01-30 NOTE — Patient Instructions (Signed)
 Medication Instructions:  Your physician has recommended you make the following change in your medication:  Increase Cardizem to 240 mg once daily Start Nexlitol 180mg  once daily for cholesterol, if too expensive send message thru MyChart  *If you need a refill on your cardiac medications before your next appointment, please call your pharmacy*   Lab Work: Your physician recommends that you return for lab work in: Today at [employer] [facility] for CMP, Magnesium, CBC, Fasting Lipid Panel, TSH, T4 & T3  If you have labs (blood work) drawn today and your tests are completely normal, you will receive your results only by: MyChart Message (if you have MyChart) OR A paper copy in the mail If you have any lab test that is abnormal or we need to change your treatment, we will call you to review the results.   Testing/Procedures: NONE   Follow-Up: At [facility], you and your health needs are our priority.  As part of our continuing mission to provide you with exceptional heart care, we have created designated Provider Care Teams.  These Care Teams include your primary Cardiologist (physician) and Advanced Practice Providers (APPs -  Physician Assistants and Nurse Practitioners) who all work together to provide you with the care you need, when you need it.  We recommend signing up for the patient portal called "MyChart".  Sign up information is provided on this After Visit Summary.  MyChart is used to connect with patients for Virtual Visits (Telemedicine).  Patients are able to view lab/test results, encounter notes, upcoming appointments, etc.  Non-urgent messages can be sent to your provider as well.   To learn more about what you can do with MyChart, go to [email or web address].    Your next appointment:  Recall in your chart for F/U appt in August. Call us around June to make appt     Provider:   Norman Herrlich, MD    Other Instructions Send a message through MyChart in 3 weeks letting  us know how you are doing.

## 2024-02-01 DIAGNOSIS — E785 Hyperlipidemia, unspecified: Secondary | ICD-10-CM

## 2024-02-01 LAB — TSH: TSH: 1.21 u[IU]/mL (ref 0.450–4.500)

## 2024-02-01 LAB — LIPID PANEL
Chol/HDL Ratio: 4.8 ratio (ref 0.0–5.0)
Cholesterol, Total: 174 mg/dL (ref 100–199)
HDL: 36 mg/dL — ABNORMAL LOW
LDL Chol Calc (NIH): 114 mg/dL — ABNORMAL HIGH (ref 0–99)
Triglycerides: 135 mg/dL (ref 0–149)
VLDL Cholesterol Cal: 24 mg/dL (ref 5–40)

## 2024-02-01 LAB — CBC WITH DIFFERENTIAL/PLATELET
Basophils Absolute: 0.1 x10E3/uL (ref 0.0–0.2)
Basos: 2 %
EOS (ABSOLUTE): 0.2 x10E3/uL (ref 0.0–0.4)
Eos: 3 %
Hematocrit: 46.3 % (ref 37.5–51.0)
Hemoglobin: 15.7 g/dL (ref 13.0–17.7)
Immature Grans (Abs): 0 x10E3/uL (ref 0.0–0.1)
Immature Granulocytes: 0 %
Lymphocytes Absolute: 1.9 x10E3/uL (ref 0.7–3.1)
Lymphs: 31 %
MCH: 31.1 pg (ref 26.6–33.0)
MCHC: 33.9 g/dL (ref 31.5–35.7)
MCV: 92 fL (ref 79–97)
Monocytes Absolute: 0.5 x10E3/uL (ref 0.1–0.9)
Monocytes: 9 %
Neutrophils Absolute: 3.2 x10E3/uL (ref 1.4–7.0)
Neutrophils: 55 %
Platelets: 242 x10E3/uL (ref 150–450)
RBC: 5.05 x10E6/uL (ref 4.14–5.80)
RDW: 12.3 % (ref 11.6–15.4)
WBC: 5.9 x10E3/uL (ref 3.4–10.8)

## 2024-02-01 LAB — COMPREHENSIVE METABOLIC PANEL WITH GFR
ALT: 37 IU/L (ref 0–44)
AST: 22 IU/L (ref 0–40)
Albumin: 4.1 g/dL (ref 3.8–4.9)
Alkaline Phosphatase: 101 IU/L (ref 44–121)
BUN/Creatinine Ratio: 12 (ref 9–20)
BUN: 14 mg/dL (ref 6–24)
Bilirubin Total: 0.5 mg/dL (ref 0.0–1.2)
CO2: 24 mmol/L (ref 20–29)
Calcium: 9.4 mg/dL (ref 8.7–10.2)
Chloride: 105 mmol/L (ref 96–106)
Creatinine, Ser: 1.17 mg/dL (ref 0.76–1.27)
Globulin, Total: 2.4 g/dL (ref 1.5–4.5)
Glucose: 96 mg/dL (ref 70–99)
Potassium: 4.9 mmol/L (ref 3.5–5.2)
Sodium: 140 mmol/L (ref 134–144)
Total Protein: 6.5 g/dL (ref 6.0–8.5)
eGFR: 72 mL/min/1.73

## 2024-02-01 LAB — MAGNESIUM: Magnesium: 2.3 mg/dL (ref 1.6–2.3)

## 2024-02-01 LAB — T3, FREE: T3, Free: 2.9 pg/mL (ref 2.0–4.4)

## 2024-02-01 LAB — T4, FREE: Free T4: 1.16 ng/dL (ref 0.82–1.77)

## 2024-03-27 ENCOUNTER — Ambulatory Visit: Payer: Self-pay | Admitting: Cardiology

## 2024-03-27 LAB — HEPATIC FUNCTION PANEL
ALT: 45 IU/L — ABNORMAL HIGH (ref 0–44)
AST: 36 IU/L (ref 0–40)
Albumin: 4.5 g/dL (ref 3.8–4.9)
Alkaline Phosphatase: 86 IU/L (ref 44–121)
Bilirubin Total: 0.7 mg/dL (ref 0.0–1.2)
Bilirubin, Direct: 0.27 mg/dL (ref 0.00–0.40)
Total Protein: 7.3 g/dL (ref 6.0–8.5)

## 2024-03-27 LAB — LIPID PANEL
Chol/HDL Ratio: 4.8 ratio (ref 0.0–5.0)
Cholesterol, Total: 154 mg/dL (ref 100–199)
HDL: 32 mg/dL — ABNORMAL LOW
LDL Chol Calc (NIH): 91 mg/dL (ref 0–99)
Triglycerides: 179 mg/dL — ABNORMAL HIGH (ref 0–149)
VLDL Cholesterol Cal: 31 mg/dL (ref 5–40)

## 2024-04-22 ENCOUNTER — Other Ambulatory Visit: Payer: Self-pay | Admitting: Cardiology

## 2024-06-14 ENCOUNTER — Other Ambulatory Visit: Payer: Self-pay | Admitting: Cardiology

## 2024-07-06 ENCOUNTER — Ambulatory Visit

## 2024-07-06 ENCOUNTER — Encounter (HOSPITAL_BASED_OUTPATIENT_CLINIC_OR_DEPARTMENT_OTHER): Payer: Self-pay | Admitting: Family Medicine

## 2024-07-06 ENCOUNTER — Ambulatory Visit (HOSPITAL_BASED_OUTPATIENT_CLINIC_OR_DEPARTMENT_OTHER)
Admission: EM | Admit: 2024-07-06 | Discharge: 2024-07-06 | Disposition: A | Discharge status: Home / Self Care | Attending: Family Medicine | Admitting: Family Medicine

## 2024-07-06 DIAGNOSIS — R079 Chest pain, unspecified: Secondary | ICD-10-CM

## 2024-07-06 DIAGNOSIS — M25512 Pain in left shoulder: Secondary | ICD-10-CM

## 2024-07-06 MED ORDER — CELECOXIB 200 MG PO CAPS: 200.0000 mg | ORAL_CAPSULE | Freq: Two times a day (BID) | ORAL | 0 refills | Status: AC | PRN

## 2024-07-06 NOTE — ED Provider Notes (Signed)
 [facility]    CSN: [id number] Arrival date & time: 07/06/24  9177      History   Chief Complaint Chief Complaint  Patient presents with   Shoulder Pain    HPI Alexander Travis is a 58 y.o. male.   58 year old male with complaint of left shoulder pain that started on the evening of 07/05/2024 and has persisted overnight.  It is sharp and sometimes dull pain.  This morning he noticed some central chest pressure but that has resolved.  The shoulder pain persists.  He has a history of PVCs and sees cardiology.  He has an appointment with cardiology this afternoon.  He does not have known heart disease but he does have hypertension and the PVCs.  He is concerned that this could be cardiac in nature.   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  I am reviewing his records his rhythm was sinus the EKG showed left anterior hemiblock and was otherwise normal including his QT interval.  Chest x-ray was performed and showed left lung base scarring.  While in the emergency room he was in s   Personal history of malignant neoplasm of kidney    dx 09/1998     PSVT (paroxysmal supraventricular tachycardia) (HCC)    Pulmonary nodules 10/02/2021   Sleep apnea    uses cpap   Stasis dermatitis 10/11/2018    Patient Active Problem List   Diagnosis Date Noted   History of kidney cancer    Pulmonary nodules 10/02/2021   Erectile dysfunction 10/02/2021   Personal history of  malignant neoplasm of kidney    GERD (gastroesophageal reflux disease)    Sleep apnea 03/05/2019   Stasis dermatitis 10/11/2018   Palpitations 11/19/2015   Palpitation 11/19/2015   PSVT (paroxysmal supraventricular tachycardia) (HCC) 05/08/2015   Cancer (HCC) 1999    Past Surgical History:  Procedure Laterality Date   CHOLECYSTECTOMY  06/09/2020   ESOPHAGOGASTRODUODENOSCOPY  10/23/2003   Minimal transiet hiatal hernia. Otherwise normal esophagogastroduodenoscopy    KIDNEY SURGERY     KNEE ARTHROSCOPY WITH MEDIAL MENISECTOMY Right 05/30/2018   Procedure: RIGHT KNEE ARTHROSCOPY WITH MEDIAL MENISECTOMY;  Surgeon: Anderson Maude ORN, MD;  Location: [facility] OR;  Service: Orthopedics;  Laterality: Right;   lung biospy Left    x2   WRIST SURGERY Right        Home Medications    Prior to Admission medications   Medication Sig Start Date End Date Taking? Authorizing Provider  aspirin  EC 81 MG tablet Take 1 tablet (81 mg total) by mouth daily. Swallow whole. 09/27/23  Yes Monetta Redell PARAS, MD  Bempedoic Acid  180 MG TABS Take 1 tablet (180 mg total) by mouth daily. 01/30/24  Yes Carlin Delon BROCKS, NP  celecoxib  (CELEBREX ) 200 MG capsule Take 1 capsule (200 mg total) by mouth 2 (two) times daily as needed for up to 15 days. 07/06/24 07/21/24  Yes Ival Domino, FNP  diltiazem  (CARDIZEM  CD) 240 MG 24 hr capsule Take 1 capsule (240 mg total) by mouth daily. 01/30/24 07/06/24 Yes Carlin Delon BROCKS, NP  flecainide  (TAMBOCOR ) 50 MG tablet TAKE 1 TABLET BY MOUTH TWICE A DAY 12/21/23  Yes Monetta Redell PARAS, MD  pantoprazole  (PROTONIX ) 40 MG tablet Take 1 tablet (40 mg total) by mouth daily. 08/10/23  Yes Monetta Redell PARAS, MD  rosuvastatin  (CRESTOR ) 10 MG tablet TAKE 1 TABLET BY MOUTH EVERY DAY 06/14/24   Monetta Redell PARAS, MD  tadalafil  (CIALIS ) 5 MG tablet Take 1 tablet (5 mg total) by mouth daily as needed for erectile dysfunction. 09/27/23   Monetta Redell PARAS, MD    Family History Family History  Problem Relation  Age of Onset   Heart disease Mother    Heart disease Father    Colon cancer Neg Hx    Esophageal cancer Neg Hx     Social History Social History   Tobacco Use   Smoking status: Never    Passive exposure: Never   Smokeless tobacco: Never  Vaping Use   Vaping status: Never Used  Substance Use Topics   Alcohol use: No   Drug use: No     Allergies   Codeine   Review of Systems Review of Systems  Constitutional:  Negative for chills and fever.  HENT:  Negative for ear pain and sore throat.   Eyes:  Negative for pain and visual disturbance.  Respiratory:  Negative for cough.   Cardiovascular:  Positive for chest pain. Negative for palpitations.  Gastrointestinal:  Negative for abdominal pain, constipation, diarrhea, nausea and vomiting.  Genitourinary:  Negative for dysuria and hematuria.  Musculoskeletal:  Positive for arthralgias (Left upper back and left shoulder pain). Negative for back pain.  Skin:  Negative for color change and rash.  Neurological:  Negative for seizures and syncope.  All other systems reviewed and are negative.    Physical Exam Triage Vital Signs ED Triage Vitals  Encounter Vitals Group     BP 07/06/24 0844 (!) 142/86     Girls Systolic BP Percentile --      Girls Diastolic BP Percentile --      Boys Systolic BP Percentile --      Boys Diastolic BP Percentile --      Pulse Rate 07/06/24 0844 67     Resp 07/06/24 0844 18     Temp 07/06/24 0844 97.8 F (36.6 C)     Temp Source 07/06/24 0844 Oral     SpO2 07/06/24 0844 97 %     Weight --      Height --      Head Circumference --      Peak Flow --      Pain Score 07/06/24 0843 2     Pain Loc --      Pain Education --      Exclude from Growth Chart --    No data found.  Updated Vital Signs BP (!) 142/86 (BP Location: Right Arm)   Pulse 67   Temp 97.8 F (36.6 C) (Oral)   Resp 18   SpO2 97%   Visual Acuity Right Eye Distance:   Left Eye Distance:   Bilateral Distance:     Right Eye Near:   Left Eye Near:    Bilateral Near:     Physical Exam Vitals and nursing note reviewed.  Constitutional:      General: He is not in acute  distress.    Appearance: He is well-developed. He is not ill-appearing or toxic-appearing.  HENT:     Head: Normocephalic and atraumatic.     Right Ear: Hearing, tympanic membrane, ear canal and external ear normal.     Left Ear: Hearing, tympanic membrane, ear canal and external ear normal.     Nose: No congestion or rhinorrhea.     Right Sinus: No maxillary sinus tenderness or frontal sinus tenderness.     Left Sinus: No maxillary sinus tenderness or frontal sinus tenderness.     Mouth/Throat:     Lips: Pink.     Mouth: Mucous membranes are moist.     Pharynx: Uvula midline. No oropharyngeal exudate or posterior oropharyngeal erythema.     Tonsils: No tonsillar exudate.  Eyes:     Conjunctiva/sclera: Conjunctivae normal.     Pupils: Pupils are equal, round, and reactive to light.  Cardiovascular:     Rate and Rhythm: Normal rate and regular rhythm.     Heart sounds: S1 normal and S2 normal. No murmur heard. Pulmonary:     Effort: Pulmonary effort is normal. No respiratory distress.     Breath sounds: Normal breath sounds. No decreased breath sounds, wheezing, rhonchi or rales.  Chest:     Chest wall: No mass, lacerations, deformity, swelling, tenderness, crepitus or edema. There is no dullness to percussion.     Comments: Unable to reproduce the central chest, shoulder or upper back pain with palpation. Abdominal:     General: Bowel sounds are normal.     Palpations: Abdomen is soft.     Tenderness: There is no abdominal tenderness.  Musculoskeletal:        General: No swelling.     Right shoulder: Normal.     Left shoulder: Normal.     Right upper arm: Normal.     Left upper arm: Normal.     Cervical back: Neck supple.  Lymphadenopathy:     Head:     Right side of head: No submental, submandibular, tonsillar,  preauricular or posterior auricular adenopathy.     Left side of head: No submental, submandibular, tonsillar, preauricular or posterior auricular adenopathy.     Cervical: No cervical adenopathy.     Right cervical: No superficial cervical adenopathy.    Left cervical: No superficial cervical adenopathy.  Skin:    General: Skin is warm and dry.     Capillary Refill: Capillary refill takes less than 2 seconds.     Findings: No rash.  Neurological:     Mental Status: He is alert and oriented to person, place, and time.  Psychiatric:        Mood and Affect: Mood normal.      UC Treatments / Results  Labs (all labs ordered are listed, but only abnormal results are displayed) Comprehensive metabolic panel: 01/31/24:   important suggestion  Newer results are available. Click to view them now.      Component Ref Range & Units (hover) 5 mo ago (01/31/24)  Glucose 96  BUN 14  Creatinine, Ser 1.17  eGFR 72  BUN/Creatinine Ratio 12  Sodium 140  Potassium 4.9  Chloride 105  CO2 24  Calcium  9.4  Total Protein 6.5  Albumin 4.1  Globulin, Total 2.4  Bilirubin Total 0.5  Alkaline Phosphatase 101  AST 22  ALT 37  Resulting Agency [facility]      EKG   Radiology No results found.  Procedures ED EKG  Date/Time: 07/06/2024 9:21  AM  Performed by: Ival Domino, FNP Authorized by: Ival Domino, FNP   ECG interpreted by ED Physician in the absence of a cardiologist: yes CONI Ival, FNP)   Previous ECG:    Previous ECG:  Unavailable Interpretation:    Interpretation: normal   Rate:    ECG rate:  63   ECG rate assessment: normal   Rhythm:    Rhythm: sinus rhythm   Ectopy:    Ectopy: none   QRS:    QRS axis:  Left   QRS intervals:  Normal   QRS conduction: normal   ST segments:    ST segments:  Normal T waves:    T waves: normal   Q waves:    Abnormal Q-waves: not present    (including critical care time)  Medications Ordered in UC Medications - No data to  display  Initial Impression / Assessment and Plan / UC Course  I have reviewed the triage vital signs and the nursing notes.  Pertinent labs & imaging results that were available during my care of the patient were reviewed by me and considered in my medical decision making (see chart for details).  Plan of Care: Left shoulder pain and some left chest pressure: EKG is normal.  Unable to reproduce the shoulder pain with exam.  Good renal function.  Celebrex  200 mg twice daily with food as needed for shoulder pain.  Encouraged shoulder exercises and use of ice or heat therapy as desired.  Encouraged to see primary care for shoulder pain if it persist.  May need to see orthopedics.  Follow-up with cardiology as planned for this afternoon.  Return here if needed.  I reviewed the plan of care with the patient and/or the patient's guardian.  The patient and/or guardian had time to ask questions and acknowledged that the questions were answered.  I provided instruction on symptoms or reasons to return here or to go to an ER, if symptoms/condition did not improve, worsened or if new symptoms occurred.  Final Clinical Impressions(s) / UC Diagnoses   Final diagnoses:  Chest pain, unspecified type  Acute pain of left shoulder     Discharge Instructions      Left shoulder pain and some left chest pressure: EKG is essentially normal.  Unable to reproduce the shoulder pain with palpation but pain persists.  Kidney function from March 2025 was good.  Use Celebrex  200 mg twice daily with food for shoulder pain, if needed.  Provided shoulder exercises.  Encouraged ice and heat therapy.  Follow-up with cardiology for further evaluation of cardiac concerns.  See primary care or orthopedics if shoulder pain persists.     ED Prescriptions     Medication Sig Dispense Auth. Provider   celecoxib  (CELEBREX ) 200 MG capsule Take 1 capsule (200 mg total) by mouth 2 (two) times daily as needed for up to 15  days. 30 capsule Ival Domino, FNP      PDMP not reviewed this encounter.   Ival Domino, FNP 07/06/24 [phone]

## 2024-07-06 NOTE — Discharge Instructions (Addendum)
 Left shoulder pain and some left chest pressure: EKG is essentially normal.  Unable to reproduce the shoulder pain with palpation but pain persists.  Kidney function from March 2025 was good.  Use Celebrex  200 mg twice daily with food for shoulder pain, if needed.  Provided shoulder exercises.  Encouraged ice and heat therapy.  Follow-up with cardiology for further evaluation of cardiac concerns.  See primary care or orthopedics if shoulder pain persists.

## 2024-07-06 NOTE — ED Triage Notes (Signed)
 Pt c/o left posterior shoulder pain dull feeling started last night. Pt felt some pressure in his chest this morning, pt has appointment with cardiologist this evening.

## 2024-07-09 ENCOUNTER — Other Ambulatory Visit: Payer: Self-pay

## 2024-07-10 ENCOUNTER — Ambulatory Visit

## 2024-07-10 VITALS — BP 117/80 | HR 73 | Ht 74.0 in | Wt 277.8 lb

## 2024-07-10 DIAGNOSIS — R079 Chest pain, unspecified: Secondary | ICD-10-CM

## 2024-07-10 DIAGNOSIS — I493 Ventricular premature depolarization: Secondary | ICD-10-CM

## 2024-07-10 DIAGNOSIS — I471 Supraventricular tachycardia, unspecified: Secondary | ICD-10-CM

## 2024-07-10 DIAGNOSIS — E782 Mixed hyperlipidemia: Secondary | ICD-10-CM

## 2024-07-10 DIAGNOSIS — E785 Hyperlipidemia, unspecified: Secondary | ICD-10-CM | POA: Insufficient documentation

## 2024-07-10 DIAGNOSIS — I2510 Atherosclerotic heart disease of native coronary artery without angina pectoris: Secondary | ICD-10-CM | POA: Insufficient documentation

## 2024-07-10 HISTORY — DX: Chest pain, unspecified: R07.9

## 2024-07-10 HISTORY — DX: Atherosclerotic heart disease of native coronary artery without angina pectoris: I25.10

## 2024-07-10 HISTORY — DX: Hyperlipidemia, unspecified: E78.5

## 2024-07-10 HISTORY — DX: Ventricular premature depolarization: I49.3

## 2024-07-10 NOTE — Assessment & Plan Note (Signed)
 Remote history of paroxysmal SVT symptomatic. Has been well-controlled on flecainide  and diltiazem . Continue the same. Flecainide  monitoring with treadmill EKG stress test along with nuclear imaging as discussed under chest pain.

## 2024-07-10 NOTE — Assessment & Plan Note (Signed)
 Lipid panel from 03/26/2024 total cholesterol 154, triglycerides 179, HDL 32, LDL 91. Unable to take statins due to family history of statin associated myopathy. Has been on bempedoic acid  taking 180 mg daily.  Advised to continue with dietary modifications. On follow-up visit will depend on stress test results will further review and addition of Zetia.

## 2024-07-10 NOTE — Patient Instructions (Signed)
 Medication Instructions:  Your physician recommends that you continue on your current medications as directed. Please refer to the Current Medication list given to you today.  *If you need a refill on your cardiac medications before your next appointment, please call your pharmacy*   Lab Work: None Ordered If you have labs (blood work) drawn today and your tests are completely normal, you will receive your results only by: MyChart Message (if you have MyChart) OR A paper copy in the mail If you have any lab test that is abnormal or we need to change your treatment, we will call you to review the results.   Testing/Procedures: Your physician has requested that you have a lexiscan  myoview . For further information please visit [email or web address] Please follow instruction sheet, as given.  The test will take approximately 3 to 4 hours to complete; you may bring reading material.  If someone comes with you to your appointment, they will need to remain in the main lobby due to limited space in the testing area. **If you are pregnant or breastfeeding, please notify the nuclear lab prior to your appointment**  How to prepare for your Myocardial Perfusion Test: Do not eat or drink 3 hours prior to your test, except you may have water. Do not consume products containing caffeine (regular or decaffeinated) 12 hours prior to your test. (ex: coffee, chocolate, sodas, tea). Do bring a list of your current medications with you.  If not listed below, you may take your medications as normal. Do not take Diltiazem  for 24 hours prior to the test.  Bring the medication to your appointment as you may be required to take it once the test is complete. Do wear comfortable clothes (no dresses or overalls) and walking shoes, tennis shoes preferred (No heels or open toe shoes are allowed). Do NOT wear cologne, perfume, aftershave, or lotions (deodorant is allowed). If these instructions are not followed, your test  will have to be rescheduled.     Follow-Up: At [facility], you and your health needs are our priority.  As part of our continuing mission to provide you with exceptional heart care, we have created designated Provider Care Teams.  These Care Teams include your primary Cardiologist (physician) and Advanced Practice Providers (APPs -  Physician Assistants and Nurse Practitioners) who all work together to provide you with the care you need, when you need it.  We recommend signing up for the patient portal called MyChart.  Sign up information is provided on this After Visit Summary.  MyChart is used to connect with patients for Virtual Visits (Telemedicine).  Patients are able to view lab/test results, encounter notes, upcoming appointments, etc.  Non-urgent messages can be sent to your provider as well.   To learn more about what you can do with MyChart, go to [email or web address].    Your next appointment:   3 month(s)  The format for your next appointment:   In Person  Provider:   Alean Kobus, MD   Other Instructions NA

## 2024-07-10 NOTE — Assessment & Plan Note (Signed)
 5.9% PVC burden on last Holter monitor from January 2021. Has been on flecainide  and diltiazem  for control. I do not see any plan treadmill EKG stress test for workup for flecainide  monitoring, will arrange for this as reviewed below under evaluation of chest pain.  Okay to continue flecainide  during the stress test advised to hold dose of diltiazem  on the day he comes in for the stress test.

## 2024-07-10 NOTE — Assessment & Plan Note (Signed)
 Atypical left shoulder discomfort and atypical chest discomfort associated with elevated blood pressures. Likely a response to uncontrolled blood pressures.  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  Advised him to continue taking aspirin  81 mg once daily. Advise treadmill EKG stress test with nuclear imaging to rule out any significant ischemia.  Is agreeable.

## 2024-07-10 NOTE — Progress Notes (Signed)
 Cardiology Consultation:    Date:  07/10/2024   ID:  Alexander Travis, DOB April 26, 1966, MRN [id number]  PCP:  Patient, No Pcp Per  Cardiologist:  Alean SAUNDERS Taitum Menton, MD   Referring MD: No ref. provider found   No chief complaint on file.    ASSESSMENT AND PLAN:   Alexander Travis 58/Male  history of SVT, PVCs 5.9% burden on heart monitor January 2021 on therapy with flecainide , mild nonobstructive CAD per cardiac CT 09/13/2023 [calcium  score of 22.9], prior Lexiscan  stress test nuclear imaging January 2021 without ischemia.  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   Here for follow-up in the setting of atypical symptoms of left shoulder discomfort and elevated blood pressures at times.  Problem List Items Addressed This Visit     PSVT (paroxysmal supraventricular tachycardia) (HCC)   Remote history of paroxysmal SVT symptomatic. Has been well-controlled on flecainide  and diltiazem . Continue the same. Flecainide  monitoring with treadmill EKG stress test along with nuclear imaging as discussed under chest pain.      Frequent PVCs   5.9% PVC burden on last Holter monitor from January 2021. Has been on flecainide  and diltiazem  for control. I do not see any plan treadmill EKG stress test for workup for flecainide  monitoring, will arrange for this as reviewed below under evaluation of chest pain.  Okay to continue flecainide  during the stress test advised to hold dose of diltiazem  on the day he comes in for the stress test.      Chest pain of uncertain etiology - Primary   Atypical left shoulder discomfort and atypical chest discomfort associated with elevated blood pressures. Likely a response to uncontrolled blood pressures.  Does have  cardiovascular risk factors in the form of mild nonobstructive CAD on October 2020 for cardiac CT imaging.  Advised him to continue taking aspirin  81 mg once daily. Advise treadmill EKG stress test with nuclear imaging to rule out any significant ischemia.  Is agreeable.       Relevant Orders   MYOCARDIAL PERFUSION IMAGING   Cardiac Stress Test: Informed Consent Details: Physician/Practitioner Attestation; Transcribe to consent form and obtain patient signature   Hyperlipidemia   Lipid panel from 03/26/2024 total cholesterol 154, triglycerides 179, HDL 32, LDL 91. Unable to take statins due to family history of statin associated myopathy. Has been on bempedoic acid  taking 180 mg daily.  Advised to continue with dietary modifications. On follow-up visit will depend on stress test results will further review and addition of Zetia.       CAD (coronary artery disease)   Atypical chest pain. Known history of mild nonobstructive cardiac coronary atherosclerosis with calcium  score 22.9 on cardiac CT imaging 09/13/2023.  Continue with aspirin  81 mg once daily. Unable to take statins. Remains on bempedoic acid  180 mg once daily.  In the setting of chest pain as reviewed below will further assess with treadmill EKG with nuclear stress test       Return to clinic tentatively in 3 months.  Earlier follow-up based on test results.  History of Present Illness:    Alexander Travis is a 58 y.o. male who is being seen today for follow-up visit. Last visit with us  in the office was 01/30/2024 with Delon Hoover, NP-C.  Pleasant man here  for the visit by himself.  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  Has history of SVT, PVCs 5.9% burden on heart monitor January 2021 on therapy with flecainide , mild nonobstructive CAD per cardiac CT 09/13/2023 [calcium  score of 22.9], prior Lexiscan  stress test nuclear imaging January 2021 without ischemia.  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  Recently 07/06/2024 he was reporting atypical back pain and shoulder discomfort associated with chest discomfort and went to urgent care facility where EKG noted sinus rhythm with T wave inversions in inferior leads which in comparison to prior EKGs showed similar changes at various times.  He was recommended to proceed to ER if he is having ongoing symptoms.  Here for the visit today mentions last week on Thursday he had symptoms of shoulder pain that lasted all night associated with atypical chest discomfort and he noticed his blood pressures were elevated with systolic 160s and diastolic 90s. He went to the urgent care and symptoms subsided later in the day. Rest of Friday and Saturday he was symptom-free on Sunday morning he woke up with symptoms of shoulder discomfort and blood pressures were once again elevated with systolic of 160s and diastolic 90s and symptoms on and off persisted for an hour or 2 after which he was able to resume his regular activities without any limitations.  Has remained symptom-free since. Good functional status at baseline and able to do his activities without limitations.  Mentions heart rates at times up to 110 bpm.  Flecainide  and diltiazem  have helped control his PVCs and palpitations as well. He on and off takes aspirin  81 mg once daily.  Does not smoke. Does not drink alcohol. No illicit drug use.  Lipid panel from 03/26/2024 total cholesterol 154, triglycerides 179, HDL 32 and LDL 91.  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  I am reviewing his records his rhythm was sinus the EKG showed left anterior hemiblock and was otherwise normal including his QT interval.  Chest x-ray was performed and showed left lung base scarring.  While in the emergency room he was in s   Personal history of malignant neoplasm of kidney    dx 09/1998     PSVT (paroxysmal supraventricular tachycardia) (HCC)    Pulmonary nodules 10/02/2021   Sleep apnea    uses cpap   Stasis dermatitis 10/11/2018    Past Surgical History:  Procedure Laterality Date   CHOLECYSTECTOMY  06/09/2020   ESOPHAGOGASTRODUODENOSCOPY  10/23/2003   Minimal transiet hiatal hernia. Otherwise normal esophagogastroduodenoscopy    KIDNEY SURGERY     KNEE ARTHROSCOPY WITH MEDIAL MENISECTOMY Right 05/30/2018   Procedure: RIGHT KNEE ARTHROSCOPY WITH MEDIAL MENISECTOMY;  Surgeon: Anderson Maude ORN, MD;  Location: [facility] OR;  Service: Orthopedics;  Laterality: Right;   lung biospy Left    x2   WRIST SURGERY Right     Current Medications: Current Meds  Medication Sig   aspirin  EC 81 MG tablet Take 1 tablet (81 mg total) by mouth daily. Swallow whole.   Bempedoic Acid  180 MG TABS Take 1 tablet (180 mg total) by mouth daily.   celecoxib  (CELEBREX ) 200 MG capsule Take 1 capsule (200 mg total) by mouth 2 (two) times daily as needed for up to 15 days.   diltiazem  (CARDIZEM  CD) 240 MG 24 hr capsule Take 1 capsule (240 mg total) by mouth daily.   flecainide  (TAMBOCOR ) 50 MG tablet TAKE 1 TABLET BY MOUTH TWICE A DAY   pantoprazole  (PROTONIX ) 40 MG tablet Take 1 tablet (40 mg total) by mouth daily.   tadalafil  (CIALIS ) 5 MG tablet Take 1 tablet (5 mg total) by mouth daily as needed for erectile dysfunction.      Allergies:   Codeine   Social History   Socioeconomic History   Marital status: Single    Spouse name: Not on file   Number of children: Not on file   Years of education: Not on file   Highest education level: Not on file  Occupational History   Not on file  Tobacco Use   Smoking status: Never    Passive exposure: Never   Smokeless tobacco: Never  Vaping Use   Vaping status: Never Used  Substance and Sexual Activity   Alcohol use: No   Drug use: No   Sexual activity: Not on file  Other Topics Concern   Not on file  Social History Narrative   Not on file   Social Drivers of Health   Financial Resource Strain: Not on file  Food Insecurity: Not on file  Transportation Needs: Not on file  Physical Activity: Not on file  Stress: Not on file  Social Connections: Not on file     Family History: The patient's family history includes Heart disease in his father and mother. There is no history of Colon cancer or Esophageal cancer. ROS:   Please see the history of present illness.    All 14 point review of systems negative except as described per history of present illness.  EKGs/Labs/Other Studies Reviewed:    The following studies were reviewed today:   EKG:       Recent Labs: 01/31/2024: BUN 14; Creatinine, Ser 1.17; Hemoglobin 15.7; Magnesium 2.3; Platelets 242; Potassium 4.9; Sodium 140; TSH 1.210 03/26/2024: ALT 45  Recent Lipid Panel    Component Value Date/Time   CHOL 154 03/26/2024 0952   TRIG 179 (H) 03/26/2024 0952   HDL 32 (L) 03/26/2024 0952   CHOLHDL 4.8 03/26/2024 0952   LDLCALC 91 03/26/2024 0952    Physical Exam:    VS:  BP 117/80   Pulse 73   Ht 6' 2 (1.88 m)   Wt 277 lb 12.8 oz (126 kg)   SpO2 96%   BMI 35.67 kg/m     Wt Readings from Last 3 Encounters:  07/10/24 277 lb 12.8 oz (126 kg)  01/30/24 285 lb 9.6 oz (129.5 kg)  09/27/23 281 lb 12.8 oz (127.8 kg)     GENERAL:  Well nourished, well developed in no acute distress NECK:  No JVD; No carotid bruits CARDIAC: RRR, S1 and S2 present, no murmurs, no rubs, no gallops CHEST:  Clear to auscultation without rales, wheezing or rhonchi  Extremities: No pitting pedal edema. Pulses bilaterally  symmetric with radial 2+ and dorsalis pedis 2+ NEUROLOGIC:  Alert and oriented x 3  Medication Adjustments/Labs and Tests Ordered: Current medicines are reviewed at length with the patient today.  Concerns regarding medicines are outlined above.  Orders Placed This Encounter  Procedures   Cardiac Stress Test: Informed Consent Details: Physician/Practitioner Attestation; Transcribe to consent form and obtain patient signature   MYOCARDIAL PERFUSION IMAGING   No orders of the defined types were placed in this encounter.   Signed, Alean jess Kobus, MD, MPH, [facility]. 07/10/2024 2:26 PM    [facility]

## 2024-07-10 NOTE — Assessment & Plan Note (Signed)
 Atypical chest pain. Known history of mild nonobstructive cardiac coronary atherosclerosis with calcium  score 22.9 on cardiac CT imaging 09/13/2023.  Continue with aspirin  81 mg once daily. Unable to take statins. Remains on bempedoic acid  180 mg once daily.  In the setting of chest pain as reviewed below will further assess with treadmill EKG with nuclear stress test

## 2024-07-12 ENCOUNTER — Encounter (HOSPITAL_COMMUNITY): Payer: Self-pay | Admitting: *Deleted

## 2024-07-15 ENCOUNTER — Other Ambulatory Visit: Payer: Self-pay | Admitting: Cardiology

## 2024-07-23 ENCOUNTER — Other Ambulatory Visit: Payer: Self-pay

## 2024-07-23 DIAGNOSIS — R079 Chest pain, unspecified: Secondary | ICD-10-CM

## 2024-07-24 ENCOUNTER — Ambulatory Visit (HOSPITAL_COMMUNITY)
Admission: RE | Admit: 2024-07-24 | Discharge: 2024-07-24 | Disposition: A | Discharge status: Home / Self Care | Source: Ambulatory Visit | Attending: Internal Medicine | Admitting: Internal Medicine

## 2024-07-24 DIAGNOSIS — R079 Chest pain, unspecified: Secondary | ICD-10-CM | POA: Insufficient documentation

## 2024-07-24 LAB — MYOCARDIAL PERFUSION IMAGING
Angina Index: 0
Duke Treadmill Score: 7
Estimated workload: 8.5
Exercise duration (min): 7 min
LV dias vol: 95 mL (ref 62–150)
LV sys vol: 26 mL (ref 4.2–5.8)
MPHR: 160 {beats}/min
Nuc Stress EF: 73 %
Peak HR: 150 {beats}/min
Percent HR: 93 %
RPE: 19
Rest HR: 75 {beats}/min
Rest Nuclear Isotope Dose: 12.2 mCi
SDS: 0
SRS: 1
SSS: 2
ST Depression (mm): 0 mm
Stress Nuclear Isotope Dose: 36.3 mCi
TID: 0.87

## 2024-07-24 MED ORDER — TECHNETIUM TC 99M TETROFOSMIN IV KIT
12.2000 | PACK | Freq: Once | INTRAVENOUS | Status: AC | PRN
Start: 2024-07-24 — End: 2024-07-24
  Administered 2024-07-24: 12.2 via INTRAVENOUS

## 2024-07-24 MED ORDER — TECHNETIUM TC 99M TETROFOSMIN IV KIT
36.3000 | PACK | Freq: Once | INTRAVENOUS | Status: AC | PRN
  Administered 2024-07-24: 36.3 via INTRAVENOUS

## 2024-07-28 ENCOUNTER — Ambulatory Visit: Payer: Self-pay

## 2024-07-28 NOTE — Progress Notes (Signed)
 No ischemia on stress test with nuclear imaging [small fixed defect]. Mild QRS widening at peak stress, while on flecainide . Please advise him to discontinue flecainide . Continue with his current medication diltiazem . If he continues to have symptoms because of ventricular ectopy or supraventricular ectopy, will need to see electrophysiologist. Will review results further in person at his follow-up visit coming up in December. Thank you

## 2024-09-12 ENCOUNTER — Other Ambulatory Visit: Payer: Self-pay | Admitting: Cardiology

## 2024-10-08 ENCOUNTER — Ambulatory Visit

## 2024-10-08 ENCOUNTER — Ambulatory Visit (HOSPITAL_BASED_OUTPATIENT_CLINIC_OR_DEPARTMENT_OTHER)
Admission: RE | Admit: 2024-10-08 | Discharge: 2024-10-08 | Disposition: A | Discharge status: Home / Self Care | Source: Ambulatory Visit

## 2024-10-08 VITALS — BP 130/88 | Ht 74.0 in | Wt 277.0 lb

## 2024-10-08 DIAGNOSIS — M25462 Effusion, left knee: Secondary | ICD-10-CM | POA: Diagnosis not present

## 2024-10-08 DIAGNOSIS — M25562 Pain in left knee: Secondary | ICD-10-CM | POA: Insufficient documentation

## 2024-10-08 DIAGNOSIS — M2392 Unspecified internal derangement of left knee: Secondary | ICD-10-CM

## 2024-10-08 MED ORDER — DICLOFENAC SODIUM 75 MG PO TBEC: 75.0000 mg | DELAYED_RELEASE_TABLET | Freq: Two times a day (BID) | ORAL | 1 refills | Status: AC

## 2024-10-08 NOTE — Progress Notes (Signed)
   Subjective:    Patient ID: Alexander Travis, male    DOB: 58 y.o., February 17, 1966   MRN: [id number]  Chief Complaint: Left knee pain  Discussed the use of AI scribe software for clinical note transcription with the patient, who gave verbal consent to proceed.  History of Present Illness 58 y/o male with past medical history significant for CAD, paroxysmal SVT, OSA, GERD, history of kidney cancer, erectile dysfunction presenting with left knee pain.   [name] is a 58 year old male who presents with left knee pain and swelling. He is accompanied by his son, [name].  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  Visible effusion.  No erythema or significant deformity. -Palpation: TTP - quad tendon, - patella, - patellar tendon, - tibial tuberosity, - pes bursa, - gerdy tubercle, + medial joint line, + lateral joint line, - posterior knee, - medial and lateral hamstrings.  prominent crepitus with flexion/extension. -AROM/PROM: 2 degrees extension, 115 degrees flexion,  normal hamstring flexibility -Strength: Unable to tolerate single-leg squat, 5/5 flexion, 5/5 extension (though painful) -Special tests:    -ACL: - lachman, -anterior drawer   -MCL: stable and mildly painful with valgus at 0/30 degrees   -LCL: stable and painless with varus at 0/30 degrees   -PCL: - sag sign   -Meniscus: + thessaly, + McMurray   -Patellofemoral: Equivocal patellar grind      Assessment & Plan:   Assessment & Plan Left knee joint effusion and suspected meniscal injury   There is significant fluid accumulation in the left knee, likely due to internal joint damage, with a suspected meniscal injury indicated by pain and tenderness, especially during twisting motions. Differential diagnosis includes meniscal tear, cruciate ligament injury, or other intra-articular pathology. Pain worsens with prolonged activity and improves with rest. No prior knee issues before surgery. An MRI is preferred for detailed assessment of soft tissue structures, considering his age and potential for good cartilage quality. X-rays of the left knee are ordered to rule out bony abnormalities. An MRI is ordered to assess for a meniscal tear or other soft tissue injuries. Prescription strength anti-inflammatory medication is prescribed to reduce pain and swelling. Ice and compression are advised to manage swelling, and a knee sleeve is provided for support and compression. A follow-up is scheduled to discuss MRI results and further management options.

## 2024-10-09 ENCOUNTER — Ambulatory Visit: Payer: Self-pay

## 2024-10-15 ENCOUNTER — Ambulatory Visit

## 2024-10-15 VITALS — BP 134/88 | HR 74 | Ht 74.0 in | Wt 287.6 lb

## 2024-10-15 DIAGNOSIS — E782 Mixed hyperlipidemia: Secondary | ICD-10-CM | POA: Diagnosis not present

## 2024-10-15 DIAGNOSIS — I2510 Atherosclerotic heart disease of native coronary artery without angina pectoris: Secondary | ICD-10-CM

## 2024-10-15 DIAGNOSIS — I493 Ventricular premature depolarization: Secondary | ICD-10-CM

## 2024-10-15 NOTE — Patient Instructions (Signed)
 Medication Instructions:  Your physician recommends that you continue on your current medications as directed. Please refer to the Current Medication list given to you today.  *If you need a refill on your cardiac medications before your next appointment, please call your pharmacy*   Lab Work: None ordered If you have labs (blood work) drawn today and your tests are completely normal, you will receive your results only by: MyChart Message (if you have MyChart) OR A paper copy in the mail If you have any lab test that is abnormal or we need to change your treatment, we will call you to review the results.   Testing/Procedures: A zio monitor was ordered today. It will remain on for 7 days. Remove 10/22/24. You will then return monitor and event diary in provided box. It takes 1-2 weeks for report to be downloaded and returned to us . We will call you with the results. If monitor falls off or has orange flashing light, please call Zio for further instructions.    Follow-Up: At [facility], you and your health needs are our priority.  As part of our continuing mission to provide you with exceptional heart care, we have created designated Provider Care Teams.  These Care Teams include your primary Cardiologist (physician) and Advanced Practice Providers (APPs -  Physician Assistants and Nurse Practitioners) who all work together to provide you with the care you need, when you need it.  We recommend signing up for the patient portal called MyChart.  Sign up information is provided on this After Visit Summary.  MyChart is used to connect with patients for Virtual Visits (Telemedicine).  Patients are able to view lab/test results, encounter notes, upcoming appointments, etc.  Non-urgent messages can be sent to your provider as well.   To learn more about what you can do with MyChart, go to [email or web address].    Your next appointment:   6 month(s)  The format for your next  appointment:   In Person  Provider:   Alean Kobus, MD    Other Instructions none  Important Information About Sugar

## 2024-10-15 NOTE — Assessment & Plan Note (Signed)
 Relatively asymptomatic. Last Holter monitor January 2021 5.9% burden.  Now off flecainide  since September 2025 after a treadmill EKG stress test with nuclear imaging showed mild widening of QRS.  Will monitor Zio patch 7 days to assess PVC burden. Since he is asymptomatic unless he has significant PVC burden we will hold off on further escalation of therapy.

## 2024-10-15 NOTE — Progress Notes (Signed)
 Cardiology Consultation:    Date:  10/15/2024   ID:  [name], DOB Oct 24, 1966, MRN [id number]  PCP:  Patient, No Pcp Per  Cardiologist:  Alean SAUNDERS Ankush Gintz, MD   Referring MD: Liborio Alean SAUNDERS, *   No chief complaint on file.    ASSESSMENT AND PLAN:   [name] 58 year old male history of SVT, PVCs 5.9% burden on heart monitor January 2021 previously on therapy with flecainide  discontinued September 2025 after exercise stress test showing QRS widening mild nonobstructive CAD per cardiac CT 09/13/2023 [calcium  score of 22.9], prior Lexiscan  stress test nuclear imaging January 2021 without ischemia. Recent treadmill EKG stress test 07/24/2024 showed average exercise capacity 8.5 METS attained 93% MPHR, no EKG or nuclear imaging changes suggestive of ischemia.  There was  QRS widening noted with exercise and normalized in recovery.  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   Here for routine follow-up  Problem List Items Addressed This Visit     Frequent PVCs - Primary   Relatively asymptomatic. Last Holter monitor January 2021 5.9% burden.  Now off flecainide  since September 2025 after a treadmill EKG stress test with nuclear imaging showed mild widening of QRS.  Will monitor Zio patch 7 days to assess PVC burden. Since he is asymptomatic unless he has significant PVC burden we will hold off on further escalation of therapy.       Relevant Orders   LONG TERM MONITOR (3-14 DAYS)   Hyperlipidemia   Statin intolerance. Continue with bempedoic acid . Last lipid panel from May 2025 LDL 91, HDL 32, total cholesterol 845 and triglycerides 179.   Recommended he establish care with PCP for regular healthcare  maintenance and advised about dietary and lifestyle modifications and strongly recommended regular exercise up to 30 minutes 5 times a week.  SABRA  He was inquiring about GLP1 agonists, from cardiac standpoint should be okay to use the medications as needed for weight loss.  Defer this to PCP.       CAD (coronary artery disease)   Asymptomatic. Continue aspirin  81 mg once daily. Continue lipid-lowering therapy as discussed below under hyperlipidemia.       Return to clinic tentatively in 6 months.   History of Present Illness:    Alexander Travis is a 58 y.o. male who is being seen today for follow-up visit. Currently does not have a PCP. Last visit in the office with me was 07/10/2024.  Here for the visit today by himself.  Continues to work at [employer] and job is physically demanding, switches car tires.  Has history of SVT, PVCs 5.9% burden on heart monitor January 2021 on therapy with flecainide , mild nonobstructive CAD per cardiac CT 09/13/2023 [calcium  score of 22.9], prior Lexiscan  stress test nuclear imaging January 2021 without ischemia.  Also has history of OSA-not on CPAP, GERD, history of renal malignancy [s/p left nephrectomy 1999], lung nodules s/p biopsies in the past he will, erectile dysfunction [hence not on beta-blockers], hyperlipidemia, history of statin myopathy in his mother and apparently had been told to avoid statins.    Treadmill EKG stress test with nuclear imaging 07/24/2024 showed no ischemia however there was QRS widening with exercise that resolved in recovery.  No mild perfusion findings  suggestive of ischemia.  Overall doing well. No active cardiac symptoms.   Functional status is good, but no regular exercise. No chest pain, shortness of breath, orthopnea paroxysmal nocturnal dyspnea.  No palpitations, lightheadedness, dizziness or syncopal episodes.  Functional status somewhat limited due to left knee pain that is disturbing his sleep.  Does not smoke or  drink alcohol.  No illicit drug use.     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  I am reviewing his records his rhythm was sinus the EKG showed left anterior hemiblock and was otherwise normal including his QT interval.  Chest x-ray was performed and showed left lung base scarring.  While in the emergency room he was in s   Personal history of malignant neoplasm of kidney    dx 09/1998     PSVT (paroxysmal supraventricular tachycardia)    Pulmonary nodules 10/02/2021   Sleep apnea    uses cpap   Stasis dermatitis 10/11/2018    Past Surgical History:  Procedure Laterality Date   CHOLECYSTECTOMY  06/09/2020   ESOPHAGOGASTRODUODENOSCOPY  10/23/2003   Minimal transiet hiatal hernia. Otherwise normal esophagogastroduodenoscopy    KIDNEY SURGERY     KNEE ARTHROSCOPY WITH MEDIAL MENISECTOMY Right 05/30/2018   Procedure: RIGHT KNEE ARTHROSCOPY WITH MEDIAL MENISECTOMY;  Surgeon: Anderson Maude ORN, MD;  Location: [facility] OR;  Service: Orthopedics;  Laterality: Right;   lung biospy Left    x2   WRIST SURGERY Right     Current Medications: Current Meds  Medication Sig   aspirin  EC 81 MG tablet Take 1 tablet (81 mg total) by mouth daily. Swallow whole.   Bempedoic Acid  180 MG TABS Take 1 tablet (180 mg total) by mouth daily.   diclofenac  (VOLTAREN ) 75 MG EC tablet Take 1 tablet (75 mg total) by mouth 2  (two) times daily.   diltiazem  (CARDIZEM  CD) 240 MG 24 hr capsule Take 1 capsule (240 mg total) by mouth daily.   pantoprazole  (PROTONIX ) 40 MG tablet TAKE 1 TABLET BY MOUTH EVERY DAY   tadalafil  (CIALIS ) 5 MG tablet Take 1 tablet (5 mg total) by mouth daily as needed for erectile dysfunction.     Allergies:   Codeine   Social History   Socioeconomic History   Marital status: Single    Spouse name: Not on file   Number of children: Not on file   Years of education: Not on file   Highest education level: Not on file  Occupational History   Not on file  Tobacco Use   Smoking status: Never    Passive exposure: Never   Smokeless tobacco: Never  Vaping Use   Vaping status: Never Used  Substance and Sexual Activity   Alcohol use: No   Drug use: No   Sexual activity: Not on file  Other Topics Concern   Not on  file  Social History Narrative   Not on file   Social Drivers of Health   Financial Resource Strain: Not on file  Food Insecurity: Not on file  Transportation Needs: Not on file  Physical Activity: Not on file  Stress: Not on file  Social Connections: Not on file     Family History: The patient's family history includes Heart disease in his father and mother. There is no history of Colon cancer or Esophageal cancer. ROS:   Please see the history of present illness.    All 14 point review of systems negative except as described per history of present illness.  EKGs/Labs/Other Studies Reviewed:    The following studies were reviewed today:   EKG:       Recent Labs: 01/31/2024: BUN 14; Creatinine, Ser 1.17; Hemoglobin 15.7; Magnesium 2.3; Platelets 242; Potassium 4.9; Sodium 140; TSH 1.210 03/26/2024: ALT 45  Recent Lipid Panel    Component Value Date/Time   CHOL 154 03/26/2024 0952   TRIG 179 (H) 03/26/2024 0952   HDL 32 (L) 03/26/2024 0952   CHOLHDL 4.8 03/26/2024 0952   LDLCALC 91 03/26/2024 0952    Physical Exam:    VS:  BP (!) 140/96   Pulse 74    Ht 6' 2 (1.88 m)   Wt 287 lb 9.6 oz (130.5 kg)   SpO2 97%   BMI 36.93 kg/m     Wt Readings from Last 3 Encounters:  10/15/24 287 lb 9.6 oz (130.5 kg)  10/08/24 277 lb (125.6 kg)  07/24/24 277 lb (125.6 kg)     GENERAL:  Well nourished, well developed in no acute distress NECK: No JVD; No carotid bruits CARDIAC: RRR, S1 and S2 present, no murmurs, no rubs, no gallops CHEST:  Clear to auscultation without rales, wheezing or rhonchi  Extremities: No pitting pedal edema. Pulses bilaterally symmetric with radial 2+ and dorsalis pedis 2+ NEUROLOGIC:  Alert and oriented x 3  Medication Adjustments/Labs and Tests Ordered: Current medicines are reviewed at length with the patient today.  Concerns regarding medicines are outlined above.  Orders Placed This Encounter  Procedures   LONG TERM MONITOR (3-14 DAYS)   No orders of the defined types were placed in this encounter.   Signed, Alean jess Kobus, MD, MPH, [facility]. 10/15/2024 8:46 AM    [facility]

## 2024-10-15 NOTE — Assessment & Plan Note (Addendum)
 Statin intolerance. Continue with bempedoic acid . Last lipid panel from May 2025 LDL 91, HDL 32, total cholesterol 845 and triglycerides 179.   Recommended he establish care with PCP for regular healthcare maintenance and advised about dietary and lifestyle modifications and strongly recommended regular exercise up to 30 minutes 5 times a week.  SABRA  He was inquiring about GLP1 agonists, from cardiac standpoint should be okay to use the medications as needed for weight loss.  Defer this to PCP.

## 2024-10-15 NOTE — Assessment & Plan Note (Signed)
 Asymptomatic. Continue aspirin  81 mg once daily. Continue lipid-lowering therapy as discussed below under hyperlipidemia.

## 2024-10-23 ENCOUNTER — Telehealth: Payer: Self-pay | Admitting: General Practice

## 2024-10-23 NOTE — Telephone Encounter (Signed)
 Made in error

## 2024-10-29 ENCOUNTER — Ambulatory Visit: Payer: Self-pay

## 2024-10-29 DIAGNOSIS — I493 Ventricular premature depolarization: Secondary | ICD-10-CM

## 2024-11-06 ENCOUNTER — Ambulatory Visit (HOSPITAL_BASED_OUTPATIENT_CLINIC_OR_DEPARTMENT_OTHER): Admitting: Family Medicine

## 2024-11-06 VITALS — BP 134/85 | HR 77 | Temp 97.9°F | Resp 16 | Ht 73.62 in | Wt 281.3 lb

## 2024-11-06 DIAGNOSIS — E291 Testicular hypofunction: Secondary | ICD-10-CM

## 2024-11-06 DIAGNOSIS — G4733 Obstructive sleep apnea (adult) (pediatric): Secondary | ICD-10-CM

## 2024-11-06 DIAGNOSIS — Z125 Encounter for screening for malignant neoplasm of prostate: Secondary | ICD-10-CM | POA: Diagnosis not present

## 2024-11-06 DIAGNOSIS — R5383 Other fatigue: Secondary | ICD-10-CM | POA: Diagnosis not present

## 2024-11-06 DIAGNOSIS — Z85528 Personal history of other malignant neoplasm of kidney: Secondary | ICD-10-CM

## 2024-11-06 LAB — VITAMIN B12: Vitamin B-12: 591 pg/mL (ref 232–1245)

## 2024-11-06 LAB — TESTOSTERONE: Testosterone: 437 ng/dL (ref 264–916)

## 2024-11-06 LAB — PSA: Prostate Specific Ag, Serum: 1.3 ng/mL (ref 0.0–4.0)

## 2024-11-06 NOTE — Assessment & Plan Note (Addendum)
 Current weight of 281 lbs, increased from 270 lbs. Weight gain may contribute to fatigue and low testosterone  levels. Discussed potential for weight loss interventions, including GLP-1 shots, contingent on insurance coverage and effectiveness. - Encouraged weight loss through lifestyle modifications - Discussed potential for GLP-1 meds.

## 2024-11-06 NOTE — Assessment & Plan Note (Addendum)
 Low testosterone  level at 304 ng/dL. Symptoms suggestive of testosterone  deficiency include fatigue and low libido. Weight loss may naturally improve testosterone  levels. - Ordered morning fasting testosterone  level - Discussed potential for testosterone  therapy if levels are significantly low and symptoms are severe - Encouraged weight loss to naturally improve testosterone  levels Orders:   Testosterone 

## 2024-11-06 NOTE — Progress Notes (Signed)
 "  Established Patient Office Visit  Subjective   Patient ID: Alexander Travis, male    DOB: 02/12/1966  Age: 58 y.o. MRN: [id number]  Chief Complaint  Patient presents with   Establish Care    Establish Care     Discussed the use of AI scribe software for clinical note transcription with the patient, who gave verbal consent to proceed.  History of Present Illness Alexander Travis is a 58 year old male with sleep apnea who presents with persistent fatigue.  He experiences persistent fatigue despite adequate sleep, significantly impacting his daily life. He has a known history of sleep apnea but has been unable to tolerate the CPAP machine, which he tried several years ago.  He has experienced a significant weight change since retiring in 2020, initially losing weight from 318 pounds to 270 pounds through dieting, but has since regained weight, currently weighing 281 pounds. He is concerned about his weight and its potential impact on his health.  He has a history of renal cell carcinoma and is known to Dr. Cornelius, although he has not had a recent appointment. He also reports a history of lung nodules, which were biopsied and reportedly found to be scar tissue.  He has a history of PVCs and a mild left bundle branch block, and has undergone a recent stress test. His parents both had heart disease and diabetes, and he is mindful of his diet to manage his cholesterol and triglycerides, which were previously high.  He reports a low testosterone  level of 304, which was checked during a previous visit.  No alcohol or tobacco use, and he has never smoked. 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 Cornelius    Hyperlipidemia 07/10/2024   f/by [facility]   Hypogonadism in male    Morbid obesity (HCC)    OSA (obstructive sleep apnea)    Intolerant of CPAP   PSVT (paroxysmal supraventricular tachycardia)     Outpatient Encounter Medications as of 11/06/2024  Medication Sig   Bempedoic Acid  180 MG TABS Take 1 tablet (180 mg total) by mouth daily.   diclofenac  (VOLTAREN ) 75 MG EC tablet Take 1 tablet (75 mg total) by mouth 2 (two) times daily.   pantoprazole  (PROTONIX ) 40 MG tablet TAKE 1 TABLET BY MOUTH EVERY DAY   tadalafil  (CIALIS ) 5 MG tablet Take 1 tablet (5 mg total) by mouth daily as needed for erectile dysfunction.   aspirin  EC 81 MG tablet Take 1 tablet (81 mg total) by mouth daily. Swallow whole. (Patient not taking: Reported on 11/06/2024)   diltiazem  (CARDIZEM  CD) 240 MG 24 hr capsule Take 1 capsule (240 mg total) by mouth daily.   No facility-administered encounter medications on file as of 11/06/2024.    Social History   Tobacco Use   Smoking status: Never    Passive exposure: Never   Smokeless tobacco: Never  Vaping Use   Vaping status: Never Used  Substance Use Topics   Alcohol use: No   Drug use: No      Review of Systems  Constitutional:  Positive for malaise/fatigue. Negative for diaphoresis, fever and weight loss.  Respiratory:  Negative for cough, shortness of breath  and wheezing.   Cardiovascular:  Negative for chest pain, palpitations, orthopnea, claudication, leg swelling and PND.      Objective:     BP 134/85 (BP Location: Right Arm, Patient Position: Sitting, Cuff Size: Large)   Pulse 77   Temp 97.9 F (36.6 C) (Oral)   Resp 16   Ht 6' 1.62 (1.87 m)   Wt 281 lb 4.8 oz (127.6 kg)   SpO2 96%   BMI 36.49 kg/m    Physical Exam Constitutional:      General: He is not in acute distress.    Appearance: Normal appearance. He is obese.  HENT:     Head: Normocephalic.  Neck:     Vascular: No carotid bruit.  Cardiovascular:     Rate and  Rhythm: Normal rate and regular rhythm.     Pulses: Normal pulses.     Heart sounds: Normal heart sounds.  Pulmonary:     Effort: Pulmonary effort is normal.     Breath sounds: Normal breath sounds.  Abdominal:     General: Bowel sounds are normal.     Palpations: Abdomen is soft.  Musculoskeletal:     Cervical back: Neck supple. No tenderness.     Right lower leg: No edema.     Left lower leg: No edema.  Neurological:     Mental Status: He is alert.      No results found for any visits on 11/06/24.    The 10-year ASCVD risk score (Arnett DK, et al., 2019) is: 10.1%    Assessment & Plan:   Assessment & Plan OSA (obstructive sleep apnea) Chronic obstructive sleep apnea with non-compliance to CPAP therapy. Reports persistent fatigue despite adequate sleep, suggesting suboptimal management of sleep apnea. - Referred to sleep specialist in [facility] for evaluation and management of sleep apnea - Discussed potential for Inspire therapy as an alternative to CPAP  Orders:   Ambulatory referral to Pulmonology  Prostate cancer screening  Orders:   PSA  Fatigue, unspecified type Chronic fatigue likely secondary to untreated obstructive sleep apnea. - Address sleep apnea as primary cause of fatigue - Encouraged weight loss to improve energy levels  Orders:   Vitamin B12  History of renal cell carcinoma History of renal cell carcinoma Renal cell carcinoma under previous surveillance by oncologist Dr. Cornelius. No recent follow-up scheduled, raising concern for potential lapse in surveillance. - Encouraged follow-up with oncologist Dr. Cornelius within the next 2-3 months    Hypogonadism in male Low testosterone  level at 304 ng/dL. Symptoms suggestive of testosterone  deficiency include fatigue and low libido. Weight loss may naturally improve testosterone  levels. - Ordered morning fasting testosterone  level - Discussed potential for testosterone  therapy if levels are  significantly low and symptoms are severe - Encouraged weight loss to naturally improve testosterone  levels Orders:   Testosterone   Morbid obesity (HCC) Current weight of 281 lbs, increased from 270 lbs. Weight gain may contribute to fatigue and low testosterone  levels. Discussed potential for weight loss interventions, including GLP-1 shots, contingent on insurance coverage and effectiveness. - Encouraged weight loss through lifestyle modifications - Discussed potential for GLP-1 meds.      Return in about 4 weeks (around 12/04/2024) for chronic follow-up.    REDDING PONCE NORLEEN FALCON., MD  "

## 2024-11-06 NOTE — Assessment & Plan Note (Addendum)
 History of renal cell carcinoma Renal cell carcinoma under previous surveillance by oncologist Dr. Cornelius. No recent follow-up scheduled, raising concern for potential lapse in surveillance. - Encouraged follow-up with oncologist Dr. Cornelius within the next 2-3 months

## 2024-11-09 ENCOUNTER — Ambulatory Visit (HOSPITAL_BASED_OUTPATIENT_CLINIC_OR_DEPARTMENT_OTHER): Payer: Self-pay | Admitting: Family Medicine

## 2024-11-14 ENCOUNTER — Telehealth

## 2024-11-14 ENCOUNTER — Telehealth: Payer: Self-pay | Admitting: *Deleted

## 2024-11-14 VITALS — Ht 73.0 in | Wt 281.0 lb

## 2024-11-14 DIAGNOSIS — M25462 Effusion, left knee: Secondary | ICD-10-CM

## 2024-11-14 DIAGNOSIS — M1712 Unilateral primary osteoarthritis, left knee: Secondary | ICD-10-CM

## 2024-11-14 DIAGNOSIS — S83242A Other tear of medial meniscus, current injury, left knee, initial encounter: Secondary | ICD-10-CM | POA: Diagnosis not present

## 2024-11-14 DIAGNOSIS — M7052 Other bursitis of knee, left knee: Secondary | ICD-10-CM | POA: Diagnosis not present

## 2024-11-14 NOTE — Telephone Encounter (Signed)
"  Referral placed   "

## 2024-11-14 NOTE — Progress Notes (Signed)
" ° ° °  Patient ID: Alexander Travis, male    DOB: 58 y.o., 1966/11/01   MRN: [id number]   Chief Complaint: Video Visit for MRI Result Review  Patient Location: [place] Provider Location: [place], [place]    Discussed the use of AI scribe software for clinical note transcription with the patient, who gave verbal consent to proceed.  History of Present Illness [name] is a 58 year old male with left knee meniscal injury who presents for follow-up after MRI evaluation.  Left knee pain and mechanical symptoms - Persistent pain and swelling since stepping in a hole. - Pain fluctuates, with worsening swelling after prolonged activity and weight-bearing. - Mechanical symptoms include limited extension and frequent clicks or clunks. 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 - No prior history of knee problems before this injury.  Imaging findings - MRI of the left knee demonstrated a complex medial meniscus tear, large effusion, tricompartmental osteoarthritis worst in the medial compartment, and mild patellar tendon bursitis. - Findings have not yet been reviewed by the patient.  Symptom management - Uses ibuprofen as needed, typically a couple of tablets in the morning when symptoms are bothersome. - Prefers to avoid medications.  Contralateral knee discomfort - Increased right knee discomfort attributed to compensating for left knee symptoms. 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   Physical Exam   Const: appears well, non-toxic, well groomed Psych: affect bright, interactive, smiling EENT: EOMI intact, conjunctiva appear normal Neck: no obvious masses, appears symmetric Resp: non-labored, appears symmetric Neuro: muscle bulk appears normal Skin: no obvious rashes noted  Assessment & Plan Left medial meniscus tear with joint effusion He has a complex tear of the left medial meniscus with  substantial joint effusion, confirmed by MRI. The tear causes mechanical symptoms, pain, and swelling. The effusion is likely secondary to the meniscal injury and underlying inflammation. Surgical intervention is not recommended due to significant concomitant osteoarthritis, as meniscal surgery in this setting may accelerate progression and increase the likelihood of requiring total knee arthroplasty. Plan includes a corticosteroid injection to the left knee to reduce inflammation and effusion, with joint fluid aspiration if indicated. He will be referred to physical therapy for rehabilitation focused on strengthening periarticular musculature and offloading the meniscus. A follow-up visit is scheduled to assess response to injection and rehabilitation. Viscosupplementation is an alternative option if the corticosteroid injection is insufficient.  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 Meniscal surgery is contraindicated due to advanced arthritis, as it may expedite progression and increase the likelihood of requiring total knee arthroplasty. Total knee replacement is not indicated at this time. Plan includes a corticosteroid injection to address inflammation and pain related to osteoarthritis and referral to physical therapy for strengthening and functional improvement. Surgical intervention for the meniscal tear is not recommended due to significant arthritis, and total knee replacement is not indicated at this time.    "

## 2024-11-14 NOTE — Telephone Encounter (Signed)
-----   Message from Redell Robes, DO sent at 11/14/2024 11:36 AM EST ----- Walterine Blackbird! Can you please send this gentleman to PT somewhere close to where he lives? Thank you! -Dr. Robes

## 2024-11-19 ENCOUNTER — Ambulatory Visit

## 2024-11-19 ENCOUNTER — Other Ambulatory Visit: Payer: Self-pay

## 2024-11-19 VITALS — BP 130/80 | Ht 74.0 in | Wt 280.0 lb

## 2024-11-19 DIAGNOSIS — M7052 Other bursitis of knee, left knee: Secondary | ICD-10-CM

## 2024-11-19 DIAGNOSIS — S83242A Other tear of medial meniscus, current injury, left knee, initial encounter: Secondary | ICD-10-CM | POA: Diagnosis not present

## 2024-11-19 DIAGNOSIS — M1712 Unilateral primary osteoarthritis, left knee: Secondary | ICD-10-CM | POA: Diagnosis not present

## 2024-11-19 DIAGNOSIS — M25462 Effusion, left knee: Secondary | ICD-10-CM

## 2024-11-19 MED ORDER — METHYLPREDNISOLONE ACETATE 40 MG/ML IJ SUSP
40.0000 mg | Freq: Once | INTRAMUSCULAR | Status: AC
  Administered 2024-11-19: 40 mg via INTRA_ARTICULAR

## 2024-11-19 NOTE — Addendum Note (Signed)
 Addended by: MARCINE HARLENE SAILOR on: 11/19/2024 09:37 AM   Modules accepted: Orders

## 2024-11-19 NOTE — Progress Notes (Signed)
" ° °  Subjective:    Patient ID: Alexander Travis, male    DOB: 59 y.o., 1965/12/19   MRN: [id number]  Chief Complaint: Left knee medial meniscus tear -steroid injection present  History of Present Illness Patient presents today for a steroid injection of his left knee after an MRI diagnosed with a complex medial meniscus tear.     Objective:   There were no vitals filed for this visit.  Left Intra-articular Knee Injection with Ultrasound Guidance Procedure Note Desiree Fleming 03-24-1966 Indications: Pain Procedure Details Following the description of risks including infection, bleeding, damage to surrounding structures, patient provided written consent for left knee joint injection procedure. US  was used to identify the suprapatellar pouch. Patient was sterilely prepped in the usual fashion with alcohol.  Following topical anesthetization with ethyl chloride they were injected with a solution of 40mg  Depo-medrol  and 3cc Mepivacaine 2% via the superolateral approach into the suprapatellar pouch. This was well visualized under ultrasound, please see associated photographic documentation. Patient tolerated well without complication.  Precautions provided. Cleaned and dressing applied.     Assessment & Plan:   Assessment & Plan  Alexander Travis sustained a complex tear of his medial meniscus we have elected to proceed with rehab and a steroid injection.  He tolerated procedure well today.  Follow-up in 6-8 weeks.   "

## 2024-11-19 NOTE — Addendum Note (Signed)
 Addended by: GEORGINA GIOVANNA BROCKS on: 11/19/2024 09:43 AM   Modules accepted: Orders

## 2024-11-27 ENCOUNTER — Encounter: Payer: Self-pay | Admitting: Primary Care

## 2024-11-27 ENCOUNTER — Ambulatory Visit: Admitting: Primary Care

## 2024-11-27 VITALS — BP 124/72 | HR 77 | Temp 98.0°F | Ht 74.0 in | Wt 281.0 lb

## 2024-11-27 DIAGNOSIS — Z6836 Body mass index (BMI) 36.0-36.9, adult: Secondary | ICD-10-CM

## 2024-11-27 DIAGNOSIS — G4733 Obstructive sleep apnea (adult) (pediatric): Secondary | ICD-10-CM

## 2024-11-27 NOTE — Patient Instructions (Addendum)
 " VISIT SUMMARY: Alexander Travis, you came in today for a sleep consultation due to your history of sleep apnea and difficulty tolerating CPAP therapy. We discussed your current sleep patterns and potential treatment options to improve your sleep quality and manage your sleep apnea.  YOUR PLAN: -OBSTRUCTIVE SLEEP APNEA: Obstructive sleep apnea is a condition where your airway becomes blocked during sleep, causing breathing pauses and disrupted sleep. We have ordered a home sleep study to assess the current severity of your sleep apnea. In the meantime, we recommend positional therapy, such as avoiding sleeping on your back and using a side sleeping position or a wedge pillow. If your sleep apnea is moderate to severe, we may consider an oral appliance, weight loss medication like Zepbound, or the Inspire device if CPAP is not tolerated. Additionally, you can try over-the-counter melatonin (3-5 mg) to help regulate your sleep-wake cycle.  -OBESITY: Obesity is a condition characterized by excessive body weight. Your BMI is 36, which classifies you as obese. Weight loss can be beneficial for managing sleep apnea and improving overall health. If your sleep apnea is moderate to severe, we may consider using Zepbound, an FDA-approved medication for weight loss and sleep apnea management.  INSTRUCTIONS: Please complete the home sleep study as ordered to assess the current severity of your sleep apnea. Follow the positional therapy recommendations and consider using melatonin to help with sleep-wake regulation. We will discuss further treatment options based on the results of your sleep study.  Follow-up 6 weeks virtual video visit with [facility] NP, review sleep study results       Sleep Apnea  Sleep apnea is a condition that affects your breathing while you are sleeping. Your tongue or soft tissue in your throat may block the flow of air while you sleep. You may have shallow breathing or stop breathing for  short periods of time. People with sleep apnea may snore loudly. There are three kinds of sleep apnea: Obstructive sleep apnea. This kind is caused by a blocked or collapsed airway. This is the most common. Central sleep apnea. This kind happens when the part of the brain that controls breathing does not send the correct signals to the muscles that control breathing. Mixed sleep apnea. This is a combination of obstructive and central sleep apnea. What are the causes? The most common cause of sleep apnea is a collapsed or blocked airway. What increases the risk? Being very overweight. Having family members with sleep apnea. Having a tongue or tonsils that are larger than normal. Having a small airway or jaw problems. Being older. What are the signs or symptoms? Loud snoring. Restless sleep. Trouble staying asleep. Being sleepy or tired during the day. Waking up gasping or choking. Having a headache in the morning. Mood swings. Having a hard time remembering things and concentrating. How is this diagnosed? A medical history. A physical exam. A sleep study. This is also called a polysomnography test. This test is done at a sleep lab or in your home while you are sleeping. How is this treated? Treatment may include: Sleeping on your side. Losing weight if you're overweight. Wearing an oral appliance. This is a mouthpiece that moves your lower jaw forward. Using a positive airway pressure (PAP) device to keep your airways open while you sleep, such as: A continuous positive airway pressure (CPAP) device. This device gives forced air through a mask when you breathe out. This keeps your airways open. A bilevel positive airway pressure (BIPAP) device. This device  gives forced air through a mask when you breathe in and when you breathe out to keep your airways open. Having surgery if other treatments do not work. If your sleep apnea is not treated, you may be at risk for: Heart  failure. Heart attack. Stroke. Type 2 diabetes or a problem with your blood sugar called insulin resistance. Follow these instructions at home: Medicines Take your medicines only as told by your health care provider. Avoid alcohol, medicines to help you relax, and certain pain medicines. These may make sleep apnea worse. General instructions Do not smoke, vape, or use products with nicotine or tobacco in them. If you need help quitting, talk with your provider. If you were given a PAP device to open your airway while you sleep, use it as told by your provider. If you're having surgery, make sure to tell your provider you have sleep apnea. You may need to bring your PAP device with you. Contact a health care provider if: The PAP device that you were given to use during sleep bothers you or does not seem to be working. You do not feel better or you feel worse. Get help right away if: You have trouble breathing. You have chest pain. You have trouble talking. One side of your body feels weak. A part of your face is hanging down. These symptoms may be an emergency. Call 911 right away. Do not wait to see if the symptoms will go away. Do not drive yourself to the hospital. This information is not intended to replace advice given to you by your health care provider. Make sure you discuss any questions you have with your health care provider. Document Revised: 08/04/2023 Document Reviewed: 01/06/2023 Elsevier Patient Education  2024 [employer]. "

## 2024-11-27 NOTE — Progress Notes (Signed)
 "  @Patient  ID: Alexander Travis, male    DOB: 11-22-1965, 59 y.o.   MRN: [id number]  No chief complaint on file.   Referring provider: Dottie Norleen PHEBE PONCE, MD  HPI: 59 year old male, never smoked. PMH significant for CAD, PSVT, sleep apnea, pulmonary nodules, GERD, hypogonadism, renal cell carcinoma, hyperlipidemia, obesity.    11/27/2024- Interim hx  Discussed the use of AI scribe software for clinical note transcription with the patient, who gave verbal consent to proceed.  History of Present Illness Alexander Travis is a 59 year old male with sleep apnea who presents for a sleep consult.  He has a history of sleep apnea and previously underwent a sleep study several years ago, which led to the prescription of a CPAP machine. He was unable to tolerate the CPAP due to discomfort and difficulty sleeping with it, despite trying different masks. He did not undergo a titration study to adjust the CPAP pressure.  His current sleep pattern involves going to bed between 10:30 PM and midnight, taking 15 to 20 minutes to fall asleep, and waking up twice during the night. He estimates getting about five hours of sleep per night, feeling tired during the day but not experiencing significant daytime sleepiness such as falling asleep while driving or during conversations.  His past medical history includes coronary artery disease, supraventricular tachycardia, pulmonary nodules, acid reflux, renal cell carcinoma, hyperlipidemia, and obesity. He has a BMI of 36. He worked third shift for 37 years, which may have impacted his sleep patterns.  Epworth score 4/24. No concern for narcolepsy or cataplexy.   Allergies[1]   There is no immunization history on file for this patient.  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 Cornelius   Hyperlipidemia 07/10/2024   f/by [facility]   Hypogonadism in male    Morbid obesity (HCC)    OSA (obstructive sleep apnea)    Intolerant of CPAP   PSVT (paroxysmal supraventricular tachycardia)     Tobacco History: Tobacco Use History[2] Counseling given: Not Answered   Outpatient Medications Prior to Visit  Medication Sig Dispense Refill   aspirin  EC 81 MG tablet Take 1 tablet (81 mg total) by mouth daily. Swallow whole. (Patient not taking: Reported on 11/06/2024) 90 tablet 3   Bempedoic Acid  180 MG TABS Take 1 tablet (180 mg total) by mouth daily. 30 tablet 11   diclofenac  (VOLTAREN ) 75 MG EC tablet Take 1 tablet (75 mg total) by mouth 2 (two) times daily. 28 tablet 1   diltiazem  (CARDIZEM  CD) 240 MG 24 hr capsule Take 1 capsule (240 mg total) by mouth daily. 90 capsule 3   pantoprazole  (PROTONIX ) 40 MG tablet TAKE 1 TABLET BY MOUTH EVERY DAY 90 tablet 3   tadalafil  (CIALIS ) 5 MG tablet Take 1 tablet (5 mg total) by mouth daily as needed for erectile dysfunction. 10 tablet 1   No facility-administered medications prior to visit.   Review of Systems  Review of Systems  Constitutional: Negative.  Negative for fatigue.  Respiratory: Negative.    Psychiatric/Behavioral:  Positive for sleep disturbance.    Physical Exam  There were no vitals taken for this visit. Physical Exam Constitutional:      Appearance: Normal appearance. He is  well-developed.  HENT:     Head: Normocephalic and atraumatic.     Mouth/Throat:     Mouth: Mucous membranes are moist.     Pharynx: Oropharynx is clear.  Cardiovascular:     Rate and Rhythm: Normal rate and regular rhythm.     Heart sounds: Normal heart sounds.  Pulmonary:     Effort: Pulmonary effort is normal. No respiratory distress.     Breath sounds: Normal breath sounds. No wheezing or rhonchi.  Musculoskeletal:        General: Normal range of motion.     Cervical back:  Normal range of motion and neck supple.  Skin:    General: Skin is warm and dry.     Findings: No erythema or rash.  Neurological:     General: No focal deficit present.     Mental Status: He is alert and oriented to person, place, and time. Mental status is at baseline.  Psychiatric:        Mood and Affect: Mood normal.        Behavior: Behavior normal.        Thought Content: Thought content normal.        Judgment: Judgment normal.     Lab Results:  CBC    Component Value Date/Time   WBC 5.9 01/31/2024 0949   WBC 8.7 01/08/2021 1500   RBC 5.05 01/31/2024 0949   RBC 5.37 (A) 10/02/2021 0000   HGB 15.7 01/31/2024 0949   HCT 46.3 01/31/2024 0949   PLT 242 01/31/2024 0949   MCV 92 01/31/2024 0949   MCH 31.1 01/31/2024 0949   MCH 30.6 01/08/2021 1500   MCHC 33.9 01/31/2024 0949   MCHC 33.7 01/08/2021 1500   RDW 12.3 01/31/2024 0949   LYMPHSABS 1.9 01/31/2024 0949   MONOABS 0.6 10/02/2020 1114   EOSABS 0.2 01/31/2024 0949   BASOSABS 0.1 01/31/2024 0949    BMET    Component Value Date/Time   NA 140 01/31/2024 0949   K 4.9 01/31/2024 0949   CL 105 01/31/2024 0949   CO2 24 01/31/2024 0949   GLUCOSE 96 01/31/2024 0949   GLUCOSE 94 01/08/2021 1500   BUN 14 01/31/2024 0949   CREATININE 1.17 01/31/2024 0949   CREATININE 1.19 10/02/2020 1114   CALCIUM  9.4 01/31/2024 0949   GFRNONAA >60 01/08/2021 1500   GFRNONAA >60 10/02/2020 1114   GFRAA 85 01/11/2020 1452    BNP    Component Value Date/Time   BNP 26.4 01/08/2021 1500    ProBNP No results found for: PROBNP  Imaging: US  DRAIN/INJ MAJOR JOINT/BURSA Result Date: 11/24/2024 Left Intra-articular Knee Injection with Ultrasound Guidance Procedure Note Alexander Travis Jun 23, 1966 Indications: Pain Procedure Details Following the description of risks including infection, bleeding, damage to surrounding structures, patient provided written consent for left knee joint injection procedure. US  was used to identify the  suprapatellar pouch. Patient was sterilely prepped in the usual fashion with alcohol.  Following topical anesthetization with ethyl chloride they were injected with a solution of 40mg  Depo-medrol  and 3cc Mepivacaine 2% via the superolateral approach into the suprapatellar pouch. This was well visualized under ultrasound, please see associated photographic documentation. Patient tolerated well without complication.  Precautions provided. Cleaned and dressing applied.   LONG TERM MONITOR (3-14 DAYS) Result Date: 10/29/2024   Predominantly sinus rhythm average heart rate 73/min [ranging from 52 to 129 bpm].   Rare ventricular and supraventricular ectopy burden, less than 1%.   2 short runs of nonsustained ventricular  tachycardia with the longest episode lasting 2 seconds [5 beats], asymptomatic.   2 short runs of supraventricular tachycardia with the longest episode lasting 10.1 seconds [18 beats], asymptomatic.   No patient triggered events or diary entries.   No high-grade AV block, pauses, sustained arrhythmias. Patch Wear Time:  8 days and 0 hours (2025-12-01T08:47:51-0500 to 2025-12-09T09:24:58-0500) Patient had a min HR of 52 bpm, max HR of 218 bpm, and avg HR of 73 bpm. Predominant underlying rhythm was Sinus Rhythm. 2 Ventricular Tachycardia runs occurred, the run with the fastest interval lasting 5 beats with a max rate of 218 bpm, the longest lasting 4 beats with an avg rate of 158 bpm. 2 Supraventricular Tachycardia runs occurred, the run with the fastest interval lasting 4 beats with a max rate of 148 bpm, the longest lasting 18 beats with an avg rate of 109 bpm. Isolated SVEs were rare (<1.0%), SVE Couplets were rare (<1.0%), and SVE Triplets were rare (<1.0%). Isolated VEs were rare (<1.0%, 154), VE Couplets were rare (<1.0%, 2), and VE Triplets were rare (<1.0%, 7).     Assessment & Plan:   1. OSA (obstructive sleep apnea) (Primary) - Home sleep test; Future  2. Morbid obesity  (HCC)  Assessment & Plan Obstructive sleep apnea Previous CPAP intolerance. Difficulty sleeping with CPAP due to discomfort and pressure issues. Sleep study results from several years ago are unavailable. Current sleep hygiene includes bedtime between 10:30 PM and midnight, with difficulty maintaining sleep, waking up twice during the night, and waking up after 5-7 hours of sleep. Moderate to severe sleep apnea can increase risks for cardiac arrhythmias, stroke, pulmonary hypertension, and diabetes. Treatment options include positional therapy, oral appliance, CPAP, and Inspire device. CPAP is the gold standard but he is likely against retrying it. Weight loss medications like Zepbound are an option if sleep apnea is moderate to severe. Inspire device is a surgical option for moderate to severe sleep apnea with CPAP intolerance and BMI <40. - Ordered home sleep study to assess current severity of sleep apnea. - Advised positional therapy: avoid sleeping on back, use side sleeping position, and consider wedge pillow. - Discussed potential use of oral appliance with dentist referral if sleep apnea is moderate to severe. - Discussed potential use of Zepbound for weight loss if sleep apnea is moderate to severe. - Discussed potential use of Inspire device if sleep apnea is moderate to severe and CPAP is not tolerated. - Recommended over-the-counter melatonin 3-5 mg to help with sleep-wake regulation.  Obesity BMI of 36. Weight loss is a potential treatment option for sleep apnea if it is moderate to severe. Zepbound is FDA approved for obesity and moderate to severe sleep apnea. Weight loss could also be beneficial for overall health and sleep apnea management. - Discussed potential use of Zepbound for weight loss if sleep apnea is moderate to severe.    Almarie LELON Ferrari, NP 11/27/2024     [1]  Allergies Allergen Reactions   Codeine Other (See Comments)    Hematuria  [2]  Social  History Tobacco Use  Smoking Status Never   Passive exposure: Never  Smokeless Tobacco Never   "

## 2024-12-04 ENCOUNTER — Other Ambulatory Visit (HOSPITAL_BASED_OUTPATIENT_CLINIC_OR_DEPARTMENT_OTHER): Payer: Self-pay | Admitting: Family Medicine

## 2024-12-04 ENCOUNTER — Encounter (HOSPITAL_BASED_OUTPATIENT_CLINIC_OR_DEPARTMENT_OTHER): Payer: Self-pay | Admitting: Family Medicine

## 2024-12-04 ENCOUNTER — Ambulatory Visit (HOSPITAL_BASED_OUTPATIENT_CLINIC_OR_DEPARTMENT_OTHER): Admitting: Family Medicine

## 2024-12-04 VITALS — BP 132/78 | HR 72 | Temp 97.5°F | Resp 16 | Wt 280.9 lb

## 2024-12-04 DIAGNOSIS — G4733 Obstructive sleep apnea (adult) (pediatric): Secondary | ICD-10-CM | POA: Diagnosis not present

## 2024-12-04 MED ORDER — WEGOVY 0.25 MG/0.5ML ~~LOC~~ SOAJ: 0.2500 mg | SUBCUTANEOUS | 0 refills | Status: DC

## 2024-12-04 NOTE — Assessment & Plan Note (Signed)
 Awaiting home sleep study packet. Treatment options discussed, including Inspire device, with emphasis on potential benefits of adequate treatment, such as improved sleep quality, increased energy, and overall better well-being. - Await home sleep study packet - Consider treatment options for obstructive sleep apnea

## 2024-12-04 NOTE — Assessment & Plan Note (Signed)
 Discussed potential use of Wegovy  for weight management. He is considering other medications starting with 'Z'. Wegovy  is noted as the most affordable option, but insurance coverage is uncertain. He is advised to consult with a pharmacist regarding coverage and to inform the staff if assistance with administration is needed. - Consult pharmacist regarding insurance coverage for Wegovy  - Contact staff for administration instructions if Wegovy  is covered Orders:   semaglutide -weight management (WEGOVY ) 0.25 MG/0.5ML SOAJ SQ injection; Inject 0.25 mg into the skin once a week.

## 2024-12-04 NOTE — Progress Notes (Signed)
 "  Established Patient Office Visit  Subjective   Patient ID: Alexander Travis, male    DOB: August 19, 1966  Age: 59 y.o. MRN: [id number]  Chief Complaint  Patient presents with   Follow-up    Follow-up    Discussed the use of AI scribe software for clinical note transcription with the patient, who gave verbal consent to proceed.  History of Present Illness Alexander Travis is a 59 year old male who presents for follow-up regarding sleep apnea and obesity management.  He has been evaluated by a sleep specialist and is awaiting a home sleep study due to a backlog for in-office studies. He is exploring treatment options for sleep apnea, including Inspire therapy.  He is considering Wegovy  injections for obesity management. He has discussed this with both his current provider and another healthcare professional who mentioned a different medication starting with 'Z'.    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 Cornelius   Hyperlipidemia 07/10/2024   f/by [facility]   Hypogonadism in male    Morbid obesity (HCC)    OSA (obstructive sleep apnea)    Managed by sleep specialist   PSVT (paroxysmal supraventricular tachycardia)     Outpatient Encounter Medications as of 12/04/2024  Medication Sig   Bempedoic Acid  180 MG TABS Take 1 tablet (180 mg total) by mouth daily.   diclofenac  (VOLTAREN ) 75 MG EC tablet Take 1 tablet (75 mg total) by mouth 2 (two) times daily.   pantoprazole  (PROTONIX ) 40 MG tablet TAKE 1 TABLET BY MOUTH EVERY DAY   semaglutide -weight management (WEGOVY ) 0.25 MG/0.5ML SOAJ SQ injection Inject 0.25 mg into the skin once a week.   tadalafil  (CIALIS ) 5 MG tablet Take 1 tablet (5 mg total) by mouth daily as needed for erectile dysfunction.    aspirin  EC 81 MG tablet Take 1 tablet (81 mg total) by mouth daily. Swallow whole. (Patient not taking: Reported on 12/04/2024)   diltiazem  (CARDIZEM  CD) 240 MG 24 hr capsule Take 1 capsule (240 mg total) by mouth daily. (Patient not taking: Reported on 12/04/2024)   No facility-administered encounter medications on file as of 12/04/2024.    Social History[1]    Review of Systems  Constitutional:  Negative for diaphoresis, fever, malaise/fatigue and weight loss.  Respiratory:  Negative for cough, shortness of breath and wheezing.   Cardiovascular:  Negative for chest pain, palpitations, orthopnea, claudication, leg swelling and PND.      Objective:     BP 132/78 (Cuff Size: Large)   Pulse 72   Temp (!) 97.5 F (36.4 C) (Oral)   Resp 16   Wt 280 lb 14.4 oz (127.4 kg)   SpO2 95%   BMI 36.07 kg/m    Physical Exam Constitutional:      General: He is not in acute distress.    Appearance: Normal appearance. He is obese.  HENT:     Head: Normocephalic.  Neck:     Vascular: No carotid bruit.  Cardiovascular:     Rate and Rhythm: Normal rate and regular rhythm.     Pulses: Normal pulses.     Heart sounds: Normal heart sounds.  Pulmonary:     Effort: Pulmonary effort is normal.     Breath sounds: Normal breath  sounds.  Abdominal:     General: Bowel sounds are normal.     Palpations: Abdomen is soft.  Musculoskeletal:     Cervical back: Neck supple. No tenderness.     Right lower leg: No edema.     Left lower leg: No edema.  Neurological:     Mental Status: He is alert.      No results found for any visits on 12/04/24.    The 10-year ASCVD risk score (Arnett DK, et al., 2019) is: 9.9%    Assessment & Plan:   Assessment & Plan OSA (obstructive sleep apnea) Awaiting home sleep study packet. Treatment options discussed, including Inspire device, with emphasis on potential benefits of adequate treatment, such as improved sleep quality, increased energy, and overall  better well-being. - Await home sleep study packet - Consider treatment options for obstructive sleep apnea     Morbid obesity (HCC) Discussed potential use of Wegovy  for weight management. He is considering other medications starting with 'Z'. Wegovy  is noted as the most affordable option, but insurance coverage is uncertain. He is advised to consult with a pharmacist regarding coverage and to inform the staff if assistance with administration is needed. - Consult pharmacist regarding insurance coverage for Wegovy  - Contact staff for administration instructions if Wegovy  is covered Orders:   semaglutide -weight management (WEGOVY ) 0.25 MG/0.5ML SOAJ SQ injection; Inject 0.25 mg into the skin once a week.      Return in about 3 months (around 03/04/2025) for physical.    REDDING II,Shaylene Paganelli F., MD    [1]  Social History Tobacco Use   Smoking status: Never    Passive exposure: Never   Smokeless tobacco: Never  Vaping Use   Vaping status: Never Used  Substance Use Topics   Alcohol use: No   Drug use: No   "

## 2024-12-05 ENCOUNTER — Other Ambulatory Visit (HOSPITAL_BASED_OUTPATIENT_CLINIC_OR_DEPARTMENT_OTHER): Payer: Self-pay | Admitting: Family Medicine

## 2024-12-05 MED ORDER — TIRZEPATIDE-WEIGHT MANAGEMENT 2.5 MG/0.5ML ~~LOC~~ SOLN: 2.5000 mg | SUBCUTANEOUS | 0 refills | Status: AC

## 2024-12-10 ENCOUNTER — Telehealth (HOSPITAL_BASED_OUTPATIENT_CLINIC_OR_DEPARTMENT_OTHER): Payer: Self-pay | Admitting: *Deleted

## 2024-12-10 NOTE — Telephone Encounter (Signed)
 Pt. Made aware.

## 2024-12-13 ENCOUNTER — Ambulatory Visit: Admitting: Primary Care

## 2025-01-08 ENCOUNTER — Ambulatory Visit: Admitting: Primary Care

## 2025-01-14 ENCOUNTER — Ambulatory Visit

## 2025-03-04 ENCOUNTER — Encounter (HOSPITAL_BASED_OUTPATIENT_CLINIC_OR_DEPARTMENT_OTHER): Admitting: Family Medicine
